# Patient Record
Sex: Female | Born: 1940 | Race: White | Hispanic: No | Smoking: Never smoker
Health system: Southern US, Community
[De-identification: ages and names within clinical notes are randomized; demographics above are authoritative.]

## PROBLEM LIST (undated history)

## (undated) DIAGNOSIS — T8859XA Other complications of anesthesia, initial encounter: Secondary | ICD-10-CM

## (undated) DIAGNOSIS — R32 Unspecified urinary incontinence: Secondary | ICD-10-CM

## (undated) DIAGNOSIS — M1990 Unspecified osteoarthritis, unspecified site: Secondary | ICD-10-CM

## (undated) DIAGNOSIS — H3530 Unspecified macular degeneration: Secondary | ICD-10-CM

## (undated) DIAGNOSIS — J189 Pneumonia, unspecified organism: Secondary | ICD-10-CM

## (undated) DIAGNOSIS — F419 Anxiety disorder, unspecified: Secondary | ICD-10-CM

## (undated) DIAGNOSIS — F32A Depression, unspecified: Secondary | ICD-10-CM

## (undated) DIAGNOSIS — G4762 Sleep related leg cramps: Secondary | ICD-10-CM

## (undated) DIAGNOSIS — Z87440 Personal history of urinary (tract) infections: Secondary | ICD-10-CM

## (undated) DIAGNOSIS — K219 Gastro-esophageal reflux disease without esophagitis: Secondary | ICD-10-CM

## (undated) DIAGNOSIS — F329 Major depressive disorder, single episode, unspecified: Secondary | ICD-10-CM

## (undated) DIAGNOSIS — C801 Malignant (primary) neoplasm, unspecified: Secondary | ICD-10-CM

## (undated) DIAGNOSIS — H442B3 Degenerative myopia with macular hole, bilateral eye: Secondary | ICD-10-CM

## (undated) DIAGNOSIS — T4145XA Adverse effect of unspecified anesthetic, initial encounter: Secondary | ICD-10-CM

## (undated) HISTORY — PX: OTHER SURGICAL HISTORY: SHX169

## (undated) HISTORY — PX: APPENDECTOMY: SHX54

## (undated) HISTORY — PX: EYE SURGERY: SHX253

## (undated) HISTORY — PX: TONSILLECTOMY: SUR1361

## (undated) HISTORY — PX: BLADDER AUGMENTATION: SHX1233

## (undated) HISTORY — PX: ABDOMINAL HYSTERECTOMY: SHX81

## (undated) HISTORY — PX: KNEE ARTHROSCOPY: SUR90

## (undated) HISTORY — PX: TUBAL LIGATION: SHX77

## (undated) HISTORY — PX: BICEPS TENDON REPAIR: SHX566

---

## 1997-07-01 ENCOUNTER — Ambulatory Visit (HOSPITAL_BASED_OUTPATIENT_CLINIC_OR_DEPARTMENT_OTHER): Admission: RE | Admit: 1997-07-01 | Discharge: 1997-07-01 | Payer: Self-pay | Admitting: Orthopedic Surgery

## 1998-11-23 ENCOUNTER — Encounter: Payer: Self-pay | Admitting: Orthopedic Surgery

## 1998-11-29 ENCOUNTER — Inpatient Hospital Stay (HOSPITAL_COMMUNITY): Admission: RE | Admit: 1998-11-29 | Discharge: 1998-12-04 | Payer: Self-pay | Admitting: Orthopedic Surgery

## 1999-12-28 ENCOUNTER — Ambulatory Visit (HOSPITAL_BASED_OUTPATIENT_CLINIC_OR_DEPARTMENT_OTHER): Admission: RE | Admit: 1999-12-28 | Discharge: 1999-12-29 | Payer: Self-pay | Admitting: Orthopedic Surgery

## 2000-09-17 ENCOUNTER — Ambulatory Visit (HOSPITAL_BASED_OUTPATIENT_CLINIC_OR_DEPARTMENT_OTHER): Admission: RE | Admit: 2000-09-17 | Discharge: 2000-09-17 | Payer: Self-pay | Admitting: Orthopedic Surgery

## 2001-11-13 ENCOUNTER — Ambulatory Visit (HOSPITAL_BASED_OUTPATIENT_CLINIC_OR_DEPARTMENT_OTHER): Admission: RE | Admit: 2001-11-13 | Discharge: 2001-11-14 | Payer: Self-pay | Admitting: Orthopedic Surgery

## 2006-03-26 ENCOUNTER — Encounter: Admission: RE | Admit: 2006-03-26 | Discharge: 2006-03-26 | Payer: Self-pay | Admitting: Orthopedic Surgery

## 2009-01-04 ENCOUNTER — Ambulatory Visit (HOSPITAL_BASED_OUTPATIENT_CLINIC_OR_DEPARTMENT_OTHER): Admission: RE | Admit: 2009-01-04 | Discharge: 2009-01-04 | Payer: Self-pay | Admitting: Orthopedic Surgery

## 2009-03-10 ENCOUNTER — Encounter: Admission: RE | Admit: 2009-03-10 | Discharge: 2009-03-10 | Payer: Self-pay | Admitting: Orthopedic Surgery

## 2009-03-15 ENCOUNTER — Ambulatory Visit (HOSPITAL_BASED_OUTPATIENT_CLINIC_OR_DEPARTMENT_OTHER): Admission: RE | Admit: 2009-03-15 | Discharge: 2009-03-15 | Payer: Self-pay | Admitting: Orthopedic Surgery

## 2009-07-31 ENCOUNTER — Encounter: Admission: RE | Admit: 2009-07-31 | Discharge: 2009-07-31 | Payer: Self-pay | Admitting: Orthopedic Surgery

## 2010-03-19 LAB — POCT HEMOGLOBIN-HEMACUE: Hemoglobin: 14.2 g/dL (ref 12.0–15.0)

## 2010-03-25 LAB — POCT HEMOGLOBIN-HEMACUE: Hemoglobin: 12 g/dL (ref 12.0–15.0)

## 2010-05-20 NOTE — Op Note (Signed)
[place]. [facility]  Patient:    Candice Nolan, Candice Nolan Visit Number: [id number] MRN: [id number]          Service Type: DSU Location: [facility] Attending Physician:  Alinda Deem Dictated by:   Alinda Deem, M.D. Proc. Date: 09/17/00 Admit Date:  09/17/2000                             Operative Report  PREOPERATIVE DIAGNOSIS:  Left elbow osteochondral loose body.  POSTOPERATIVE DIAGNOSES:  Left elbow chondral flap tears of the radial head as well as the tip of the olecranon, status post elbow dislocation five or six months ago.  PROCEDURE:  Left elbow arthroscopic removal of chondromalacia with flap tears from the radial head and the tip of the olecranon.  SURGEON:  Feliberto Gottron. Turner Daniels, M.D.  FIRST ASSISTANT:  Dorthula Matas, P.A.-C.  ANESTHETIC:  General endotracheal.  ESTIMATED BLOOD LOSS:  Minimal.  FLUID REPLACEMENT:  700 cc of crystalloid.  DRAINS PLACED:  None.  TOURNIQUET TIME:  None.  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  She has had some persistent pain, catching, and feelings of subluxation or something like it since coming out of her range of motion brace about a month ago.  MRI scan shows some possible chondromalacia of the radial head with loose bodies, and she is taken for arthroscopic evaluation of her left elbow as well as evaluation under anesthesia for ligamentous instability.  She did regain a full range of motion after her injury and has been able to function as a rehab nurse over the last six weeks without too much difficulty including some travel.  DESCRIPTION OF PROCEDURE:  The patient identified by arm band, taken to the operating room at [facility].  Appropriate anesthetic monitors were attached, and general endotracheal anesthesia  induced with the patient in the supine position.  She was then rolled into the right lateral decubitus position and fixed there with a bean bag, and then the left upper extremity prepped and draped over the arthroscopic elbow holding device with a tourniquet applied high to the left arm but never used.  Using the anterolateral portal just coming over the radial head, an 18-gauge needle was inserted into the joint, and the elbow joint filled up with 0.5% Marcaine and epinephrine solution.  Using a #11 blade to half the depth, we then made an anterolateral portal and introduced the 2.9-mm arthroscope from [employer]. Under arthroscopic guidance, an anteromedial portal was then made allowing [place] to remove some inflamed synovium to the anterior compartment and to better visualize the radial head which had a fairly impressive cartilage flap tear with a loose body which was debrided with a 3.5-mm Gator sucker shaver from [employer].  There was also some chondromalacia off the tip of the coronoid process which was intact, and this was debrided as well.  As far as ligamentous instability goes, the elbow did not open up unusually to the valgus stress test or the varus stress test, although I would grade the laxity at 1+ since the scope could be moved into the gap between the radial head and the capitellum without too much difficulty.  Satisfied with the anterior debridement and removal of the flap tears, photographic documentation was then made.  A mid-posterior portal was then made  allowing introduction of the arthroscope into the posterior compartment which was also evaluated, and there was a slight amount of chondromalacia off the tip of the olecranon, probably from either the reduction or dislocation maneuver.  An accessory anterolateral portal was then made in the groove between the midolecranon and the lateral side of the humeral condyle allowing visualization of the bare spot in the olecranon and  visualization of the radial head from posterior.  No further tears were noted.  At this point, the elbow was washed out with normal saline solution.  The arthroscopic instruments were removed, dressed in Xeroform, 4 x 4 dressing, sponges, Webril, and Ace wrap, and a sling applied.  The patient was awakened and taken to the recovery room without difficulty.  The mid-posterior portal was closed with a single Steri-Strip prior to the application of the dressing.  At this point, the patient was awakened and taken to the recovery room without difficulty. Dictated by:   Alinda Deem, M.D. Attending Physician:  Alinda Deem DD:  09/17/00 TD:  09/17/00 Job: [id number]

## 2010-05-20 NOTE — Discharge Summary (Signed)
[place]. [facility]  Patient:    Candice Nolan, Candice Nolan                       MRN: [id number] Adm. Date:  11/29/98 Disc. Date: 12/04/98 Attending:  Alinda Deem, M.D. Dictator:   Dorthula Matas, P.A.-C.                           Discharge Summary  PRIMARY DIAGNOSIS:  End-stage degenerative joint disease of the right knee.  PROCEDURE WHILE IN HOSPITAL:  Right total knee arthroplasty and insertion of epidural catheter for postoperative pain management.  INTRODUCTION:  The patient is a 70 year old female admitted for treatment of DJD of the right knee, end stage.  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 Turner Daniels after failing conservative care after an injury.  She also underwent arthroscopy in 1998 with partial arthroscopic lateral medial meniscectomies and chondromalacia. She has continued with postoperative pain and swelling, failed nonsteroidal anti-inflammatories, rest, and physical therapy.  No postoperative complications. The patients pain awakens her at night, prevents ADLs.  The patient cannot walk  distances secondary to pain.  The patient wishes elective total knee arthroplasty. X-rays show arthritic narrowing with sclerotic changes.  SOCIAL HISTORY:  No tobacco, no ethanol, no IV drug abuse.  She is a case Production designer, theatre/television/film with [employer].  She lives with her husband.  PAST MEDICAL HISTORY:  Usual childhood diseases.  FAMILY HISTORY:  Includes cancer, CAD.  Father died with an MI, mother with cancer. Adult diseases DJD.  PAST SURGICAL HISTORY:  Knee arthroscopy in 1998-1999.  Hysterectomy.  Bladder surgery in 1991.  Carpal tunnel in 1998.  Appendectomy.  No difficulties with ______ .  ALLERGIES:  IODINE causes respiratory distress.  TALWIN causes breathing difficulties.  VITAMIN C causes swelling.  MEDICATIONS ON  ADMISSION: 1. Imodium AD 2 to 4 p.o. q.d. 2. Nitrofurantoin 100 mg 1 p.o. q.d. 3. Estratest 1 p.o. q.d. 4. Ritalin 20 mg 1 p.o. q.h.s. 5. Zoloft 100 mg 1/2 b.i.d. 5. Ritalin SR 20 mg 1 p.o. q.a.m. 6. Darvocet 100 mg 1 p.o. q.6h. 7. Aflexa 1 p.o. b.i.d.  REVIEW OF SYSTEMS:  Positive for decreased hearing and wearing of glasses. Positive for irritable bowel syndrome and urinary retention.  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  PHYSICAL EXAMINATION:  VITAL SIGNS:  The patient is 61 inches tall, weighs 162 pound.  Blood pressure 130/76.  HEENT:  Head is normocephalic, atraumatic.  Eyes PERRLA, EOM.  Ears: TMs clear.  Nose: Clear, no polyps.  Throat clear.  NECK:  Supple with full range of motion.  LUNGS:  Clear to auscultation and percussion.  CARDIOVASCULAR:  Regular rate and rhythm with no murmurs, rubs, or gallops.  ABDOMEN:   Soft, nontender, no masses.  Bowel sounds 2+.  GU/RECTAL:  Deferred.  NEUROLOGIC:  Alert and oriented x 3.  Normal speech pattern.  Cranial nerves II-XII grossly intact.  ORTHOPEDIC:  Right knee has no effusion.  Tender over medial greater than lateral joint line.  Moderate pain and joint tenderness.  Forward flexion 120, forward flexion retraction 0.  Pain to all ranges of motion.  Neurovascularly intact to  light touch motor and DTRs with 2+ PT and DP pulses.  LABORATORY DATA:  Preoperative labs including CBC, CMET, chest x-ray, EKG, PT, nd  PTT were all within normal limits with the exception of an EKG which showed nonspecific T wave abnormality which had increased since 1998 tracing.  HOSPITAL COURSE:  On the day of admission, the patient was taken to the operating room of [facility] where she underwent a right total knee arthroplasty using [employer] Scorpio components, #7 femur, #5 tibia, 10 mm PS spacer, #5 medial patella button.  All components were cemented.  Foley catheter and medium Hemovac  were placed.  She was placed on perioperative antibiotics for the first 48 hours.  She was placed on postoperative epidural catheter for pain  relief.  She began physical therapy the first postoperative day with CPM placed  postoperatively.  On postoperative day #1, the patient was awake and alert.  She had some mild nausea.  Vital signs were stable with T-max 102.0 and ranged 100.2.  PS stockings in place.  Hemoglobin 10.7.  Urine output 700 cc each shift.  PT 15.5.  She was  neurovascularly intact and otherwise orthopedically stable.  Physical therapy was begun, and a rehabilitation consult was obtained.  It was felt that the patient  would probably progress well and not require further inpatient rehabilitation, ut they would follow.  She remained on the postoperative epidural.  She was also placed on postoperative Coumadin per pharmacy protocol and remained on that.  On the second postoperative day, the patient was awake and alert.  She was taking P.o.s well.  T-max 102 ranging to 99.  Wound was clean and dry, no pus, no drainage.  The drain had 40 cc of output and was discontinued.  She was neurovascularly intact.  Hemoglobin 9.4, WBC 9.8.  Foley was working well.  She was otherwise stable and continued with Coumadin and physical therapy.  Her epidural was discontinued on the second postoperative day, and she was switched to p.o. ain medications.  On the third postoperative day, the patient reported steady improvement.  T-max  100.8, ranging 97.3.  Vital signs were stable.  Hemoglobin 8.8, WBC 9.6.  INR 1.4. Dressing was dry.  Wound was benign.  She continued to make slow but steady progress in therapy.  On postoperative day #4, the patient was complaining of some increased pain and  swelling in her right calf.  T-max 100.5, ranging 98.4.  Vital signs were stable with some increased pulse.  INR 1.6.  Dressing was dry.  Wound was benign.  She had a mild effusion with  positive increased calf edema as well as ecchymosis and increased tenderness which also was increased by any type of movement for flexion.  She remained neurovascularly intact.  She underwent a Doppler study which ruled out any type of DVT, and she continued after that with physical therapy.  On postoperative day #5, the patient was without complaint.  She was afebrile.  Vital signs were stable.  Urine output remained steady.  Foley had been discontinued the day before.  The patient does do in and out catheterization herself and was having no difficulty with this.  INR 1.7.  Range of motion of the knee 0 to 70 degrees on CPM.  Wound was benign.  Pulses were intact.  The calf as decreased in tenderness.  She was otherwise medically stable and orthopedically  improved and was discharged home today in the care of her family.  DIET:  Regular.  DISCHARGE MEDICATIONS:  Coumadin per pharmacy protocol, Tylox for pain.  Resume  home medications.  ACTIVITY:  Weightbearing as tolerated with  walker or crutch.  WOUND CARE:  Dressing change q.d. or as needed.  FOLLOWUP:  Return to clinic in one week time with Dr. Turner Daniels, sooner if she should have increase in temperature greater than 101 and increased drainage, or pain not well controlled by oral pain medications.  CONDITION ON DISCHARGE:  At the time of her discharge, she was transferring independently and had passed her physical therapy goals. DD:  01/12/99 TD:  01/12/99 Job: [id number]

## 2010-05-20 NOTE — Procedures (Signed)
[place]. [facility]  Patient:    MAYUMI SUMMERSON                    MRN: [id number] Proc. Date: 11/29/98 Adm. Date:  [id number] Attending:  Alinda Deem CC:         The Anesthesia Department                           Procedure Report  PROCEDURE:  Epidural catheter placement for postoperative pain relief.  BODY OF REPORT:  I was consulted by Dr. ______  to placement of epidural catheter into Ms. Stys for postoperative pain relief.  I had the opportunity to discuss the procedure with the patient preoperatively, explain the risks and benefits, nd she agreed to proceed.  At the end of the procedure, prior to emergence from anesthesia, the patient was turned into the right lateral decubitus position.  Her back was prepped with Betadine.  Using a #17 Tuohy needle, the epidural space was easily cannulated in the L3-4 interspace in the midline using loss-of-resistance technique.  I threaded a catheter 5 cm into the epidural space, and had frank blood come back through he catheter on aspiration.  I was unable to get that to clear, and withdrew the needle and the catheter.  I then moved one space higher to the L2-3 interspace, and using a #17 Tuohy needle once again, easily cannulated the epidural space.  I threaded a catheter 5 cm into the epidural space and removed the needle.  The catheter was  firmly fixed to the patients back.  After negative aspiration, I injected 10 cc of 0.5% Xylocaine without any problem.  The patient was then turned supine, extubated, and taken to the recovery room in stable condition.  A fentanyl/Marcaine infusion will be done in the PACU, and she will be followed on the floor by the anesthesiology service. DD:  11/29/98 TD:  11/29/98 Job: [id number]

## 2011-12-18 ENCOUNTER — Other Ambulatory Visit: Payer: Self-pay | Admitting: Orthopedic Surgery

## 2011-12-18 DIAGNOSIS — M25512 Pain in left shoulder: Secondary | ICD-10-CM

## 2011-12-23 ENCOUNTER — Ambulatory Visit
Admission: RE | Admit: 2011-12-23 | Discharge: 2011-12-23 | Disposition: A | Discharge status: Home / Self Care | Payer: Medicare Other | Source: Ambulatory Visit | Attending: Orthopedic Surgery | Admitting: Orthopedic Surgery

## 2011-12-23 DIAGNOSIS — M25512 Pain in left shoulder: Secondary | ICD-10-CM

## 2016-12-05 ENCOUNTER — Other Ambulatory Visit: Payer: Self-pay | Admitting: Orthopedic Surgery

## 2017-01-11 ENCOUNTER — Other Ambulatory Visit: Payer: Self-pay

## 2017-01-11 ENCOUNTER — Ambulatory Visit (HOSPITAL_COMMUNITY)
Admission: RE | Admit: 2017-01-11 | Discharge: 2017-01-11 | Disposition: A | Discharge status: Home / Self Care | Payer: Medicare Other | Source: Ambulatory Visit | Attending: Orthopedic Surgery | Admitting: Orthopedic Surgery

## 2017-01-11 ENCOUNTER — Encounter (HOSPITAL_COMMUNITY)
Admission: RE | Admit: 2017-01-11 | Discharge: 2017-01-11 | Disposition: A | Discharge status: Home / Self Care | Payer: Medicare Other | Source: Ambulatory Visit | Attending: Orthopedic Surgery | Admitting: Orthopedic Surgery

## 2017-01-11 ENCOUNTER — Encounter (HOSPITAL_COMMUNITY): Payer: Self-pay

## 2017-01-11 DIAGNOSIS — Z79899 Other long term (current) drug therapy: Secondary | ICD-10-CM | POA: Insufficient documentation

## 2017-01-11 DIAGNOSIS — Z01818 Encounter for other preprocedural examination: Secondary | ICD-10-CM | POA: Diagnosis not present

## 2017-01-11 DIAGNOSIS — Z01810 Encounter for preprocedural cardiovascular examination: Secondary | ICD-10-CM | POA: Insufficient documentation

## 2017-01-11 DIAGNOSIS — R9431 Abnormal electrocardiogram [ECG] [EKG]: Secondary | ICD-10-CM | POA: Diagnosis not present

## 2017-01-11 HISTORY — DX: Major depressive disorder, single episode, unspecified: F32.9

## 2017-01-11 HISTORY — DX: Depression, unspecified: F32.A

## 2017-01-11 HISTORY — DX: Degenerative myopia with macular hole, bilateral: H44.2B3

## 2017-01-11 HISTORY — DX: Malignant (primary) neoplasm, unspecified: C80.1

## 2017-01-11 HISTORY — DX: Unspecified osteoarthritis, unspecified site: M19.90

## 2017-01-11 HISTORY — DX: Personal history of urinary (tract) infections: Z87.440

## 2017-01-11 HISTORY — DX: Gastro-esophageal reflux disease without esophagitis: K21.9

## 2017-01-11 HISTORY — DX: Anxiety disorder, unspecified: F41.9

## 2017-01-11 HISTORY — DX: Adverse effect of unspecified anesthetic, initial encounter: T41.45XA

## 2017-01-11 HISTORY — DX: Other complications of anesthesia, initial encounter: T88.59XA

## 2017-01-11 HISTORY — DX: Pneumonia, unspecified organism: J18.9

## 2017-01-11 LAB — COMPREHENSIVE METABOLIC PANEL
ALK PHOS: 68 U/L (ref 38–126)
ALT: 15 U/L (ref 14–54)
ANION GAP: 10 (ref 5–15)
AST: 20 U/L (ref 15–41)
Albumin: 4.5 g/dL (ref 3.5–5.0)
BILIRUBIN TOTAL: 0.5 mg/dL (ref 0.3–1.2)
BUN: 27 mg/dL — ABNORMAL HIGH (ref 6–20)
CALCIUM: 9.6 mg/dL (ref 8.9–10.3)
CO2: 25 mmol/L (ref 22–32)
CREATININE: 0.66 mg/dL (ref 0.44–1.00)
Chloride: 103 mmol/L (ref 101–111)
Glucose, Bld: 94 mg/dL (ref 65–99)
Potassium: 3.8 mmol/L (ref 3.5–5.1)
Sodium: 138 mmol/L (ref 135–145)
TOTAL PROTEIN: 7.3 g/dL (ref 6.5–8.1)

## 2017-01-11 LAB — CBC WITH DIFFERENTIAL/PLATELET
Basophils Absolute: 0 10*3/uL (ref 0.0–0.1)
Basophils Relative: 1 %
Eosinophils Absolute: 0.1 10*3/uL (ref 0.0–0.7)
Eosinophils Relative: 1 %
HEMATOCRIT: 39 % (ref 36.0–46.0)
HEMOGLOBIN: 12.4 g/dL (ref 12.0–15.0)
LYMPHS ABS: 1.2 10*3/uL (ref 0.7–4.0)
LYMPHS PCT: 18 %
MCH: 29 pg (ref 26.0–34.0)
MCHC: 31.8 g/dL (ref 30.0–36.0)
MCV: 91.1 fL (ref 78.0–100.0)
Monocytes Absolute: 0.3 10*3/uL (ref 0.1–1.0)
Monocytes Relative: 5 %
NEUTROS PCT: 75 %
Neutro Abs: 5.1 10*3/uL (ref 1.7–7.7)
Platelets: 283 10*3/uL (ref 150–400)
RBC: 4.28 MIL/uL (ref 3.87–5.11)
RDW: 13.5 % (ref 11.5–15.5)
WBC: 6.8 10*3/uL (ref 4.0–10.5)

## 2017-01-11 LAB — SURGICAL PCR SCREEN
MRSA, PCR: NEGATIVE
Staphylococcus aureus: NEGATIVE

## 2017-01-11 LAB — TYPE AND SCREEN
ABO/RH(D): A NEG
Antibody Screen: NEGATIVE

## 2017-01-11 LAB — ABO/RH: ABO/RH(D): A NEG

## 2017-01-11 LAB — APTT: aPTT: 33 seconds (ref 24–36)

## 2017-01-11 LAB — PROTIME-INR
INR: 0.9
PROTHROMBIN TIME: 12.1 s (ref 11.4–15.2)

## 2017-01-11 NOTE — Anesthesia Preprocedure Evaluation (Addendum)
Anesthesia Evaluation  Patient identified by MRN, date of birth, ID band Patient awake    Reviewed: Allergy & Precautions, NPO status   History of Anesthesia Complications (+) history of anesthetic complications  Airway Mallampati: II  TM Distance: >3 FB Neck ROM: Full    Dental no notable dental hx.    Pulmonary neg pulmonary ROS,    breath sounds clear to auscultation       Cardiovascular negative cardio ROS   Rhythm:Regular Rate:Normal     Neuro/Psych negative neurological ROS     GI/Hepatic Neg liver ROS, GERD  ,  Endo/Other    Renal/GU negative Renal ROS     Musculoskeletal  (+) Arthritis ,   Abdominal (+) + obese,   Peds  Hematology   Anesthesia Other Findings   Reproductive/Obstetrics                             Anesthesia Physical Anesthesia Plan  ASA: II  Anesthesia Plan: General   Post-op Pain Management:  Regional for Post-op pain   Induction: Intravenous  PONV Risk Score and Plan: 4 or greater and Treatment may vary due to age or medical condition, Ondansetron and Dexamethasone  Airway Management Planned: Oral ETT  Additional Equipment:   Intra-op Plan:   Post-operative Plan: Extubation in OR  Informed Consent: I have reviewed the patients History and Physical, chart, labs and discussed the procedure including the risks, benefits and alternatives for the proposed anesthesia with the patient or authorized representative who has indicated his/her understanding and acceptance.   Dental advisory given  Plan Discussed with: CRNA  Anesthesia Plan Comments: (See my note, particularly regarding anesthesia concerns/inquiries. Myra Gianotti, PA-C)       Anesthesia Quick Evaluation

## 2017-01-11 NOTE — Pre-Procedure Instructions (Signed)
Candice Nolan  01/11/2017      [facility] [place],  - [address] [place] [address] Phone: [phone] Fax: [phone]    Your procedure is scheduled on January 18, 2017.  Report to [facility] Admitting at 530 AM.  Call this number if you have problems the morning of surgery:  [phone]   Remember:  Do not eat food or drink liquids after midnight.  Take these medicines the morning of surgery with A SIP OF WATER hydrocodone-acetaminophen (norco), omeprazole (prilosec),.  7 days prior to surgery STOP taking any meloxicam (mobic), Aspirin (unless otherwise instructed by your surgeon), Aleve, Naproxen, Ibuprofen, Motrin, Advil, Goody's, BC's, all herbal medications, fish oil, and all vitamins  Continue all other medications as instructed by your physician except follow the above medication instructions before surgery   Do not wear jewelry, make-up or nail polish.  Do not wear lotions, powders, or perfumes, or deodorant.  Do not shave 48 hours prior to surgery.   Do not bring valuables to the hospital.  [facility] is not responsible for any belongings or valuables.  Contacts, dentures or bridgework may not be worn into surgery.  Leave your suitcase in the car.  After surgery it may be brought to your room.  For patients admitted to the hospital, discharge time will be determined by your treatment team.  Patients discharged the day of surgery will not be allowed to drive home.   Special instructions:  [place]- Preparing For Surgery  Before surgery, you can play an important role. Because skin is not sterile, your skin needs to be as free of germs as possible. You can reduce the number of germs on your skin by washing with CHG (chlorahexidine gluconate) Soap before surgery.  CHG is an antiseptic cleaner which kills germs and bonds with the skin to continue killing germs even after washing.  Please do not  use if you have an allergy to CHG or antibacterial soaps. If your skin becomes reddened/irritated stop using the CHG.  Do not shave (including legs and underarms) for at least 48 hours prior to first CHG shower. It is OK to shave your face.  Please follow these instructions carefully.   1. Shower the NIGHT BEFORE SURGERY and the MORNING OF SURGERY with CHG.   2. If you chose to wash your hair, wash your hair first as usual with your normal shampoo.  3. After you shampoo, rinse your hair and body thoroughly to remove the shampoo.  4. Use CHG as you would any other liquid soap. You can apply CHG directly to the skin and wash gently with a scrungie or a clean washcloth.   5. Apply the CHG Soap to your body ONLY FROM THE NECK DOWN.  Do not use on open wounds or open sores. Avoid contact with your eyes, ears, mouth and genitals (private parts). Wash Face and genitals (private parts)  with your normal soap.  6. Wash thoroughly, paying special attention to the area where your surgery will be performed.  7. Thoroughly rinse your body with warm water from the neck down.  8. DO NOT shower/wash with your normal soap after using and rinsing off the CHG Soap.  9. Pat yourself dry with a CLEAN TOWEL.  10. Wear CLEAN PAJAMAS to bed the night before surgery, wear comfortable clothes the morning of surgery  11. Place CLEAN SHEETS on your bed the night of your  first shower and DO NOT SLEEP WITH PETS.  Day of Surgery: Do not apply any deodorants/lotions. Please wear clean clothes to the hospital/surgery center.    Please read over the following fact sheets that you were given. Pain Booklet, Coughing and Deep Breathing, MRSA Information and Surgical Site Infection Prevention

## 2017-01-11 NOTE — Progress Notes (Addendum)
HYI:FOYDXA, Christian Mate, MD  Cardiologist: pt denies  EKG: pt denies past year  Stress test: 5+ years ago  ECHO: pt denies ever  Cardiac Cath: pt denies ever  Chest x-ray: pt denies past year, obtained today  Pt refuses to give urine speciman.  She has had bladder augmentation 25 years ago and self caths.  She said it always comes back with bacteria and she does not need to be on antibiotics

## 2017-01-11 NOTE — Progress Notes (Addendum)
Anesthesia PAT Evaluation: Patient is a 77 year old female scheduled for right reverse shoulder arthroplasty on 01/18/17 by Dr. Tania Ade.   Patient requested anesthesia consult while at PAT. She wanted to discuss type of anesthesia and review a few other concerns.  -  Based on input from anesthesiologist Dr. Tamela Gammon, I discussed that she should anticipate nerve block (right interscalene) and pre-medication with IV Versed and Fentanyl and general anesthesia. She inquired if PCA would be used post-operatively, but I deferred her back to Dr. Tamera Punt.  - She takes alprazolam prior to procedures. If she does not take this, she feels her nerves with lead to her acting harsh. She also had a bad exerperiecne during a colonoscopy when the oral airway was placed before she was sedated. We discussed that she can take alprazolam as prescribed on the morning of surgery. She will plan to take this just before she arrives in hopes she will feel more at ease for her surgery.  - She is concerned about bowel and bladder issues. She is going to watch her diet prior to surgery. She is prone to retaining large volumes or urine, so she reported that Dr. Tamera Punt is planning to have a foley catheter placed.  - We also discussed her prior anesthesia history that included a reported elevated temperature after surgery in 1970 carpal tunnel surgery--was initially unsure if could have been malignant hyperthermia ([place]), but denied getting a letter from anesthesia or having to go to the ICU. She thinks she may have stayed overnight for observation. She has had multiple procedures since without incidence. She had left shoulder arthrosc[facility] at [facility] on 03/15/09 (Dr. Mayer Camel) and received Suprane/Ultane and succinylcholine with reaction.   History includes never smoker, GERD, skin cancer excision (BCC), bladder augmentation (occasional I&O cath), anxiety, depression, arthritis, bilateral degenerative myopia with macular  hold, right TKR, hysterectomy, appendectomy, left shoulder arthroscopy '11, carpal tunnel release '70 [facility] Ash), tonsillectomy. 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  PCP is Dr. Merilynn Finland.  Meds include afibercept (macular degeneration), Xanax, Norco, Ritalin LA, Prilosec calcium with vitamin D, cranberry capsules (for UTI prevention).   BP (!) 159/61   Pulse 84   Temp 36.5 C   Resp 18   Ht 4\' 11"  (1.499 m)   Wt 131 lb 6.4 oz (59.6 kg)   SpO2 98%   BMI 26.54 kg/m  Exam shows a pleasant Caucasian female in NAD. She is wearing glasses. She can ambulate independently. Heart RRR, no murmur noted. Lungs clear.  EKG 01/11/17: NSR, non-specific ST abnormality. No significant change since last tracing 03/10/09.  CXR 01/11/17: IMPRESSION: No evidence for acute cardiopulmonary abnormality.  Preoperative labs noted. She was unable to provide a UA at PAT, because she had done an I&O cath prior to her appointment. I left a voice message with Myriam Jacobson at Dr. Bettina Gavia office regarding this. Can be done on the day of surgery unless Dr. Tamera Punt wants patient to have beforehand.  If no acute changes then I anticipate that she can proceed as planned. Prior anesthesia history discussed with Dr. Tobias Alexander and he reviewed 03/15/09 anesthesia records. Her description of events in 1970 do not sound consistent with MH. She did not think she had ever been told she may have had MH reaction. Furthermore, she received triggering agents on 03/15/09 without incident, so no planned anesthesia restrictions at this point. Her assigned anesthesiologist will evaluate on the day of surgery.  Myra Gianotti, PA-C [facility] Short Stay Center/Anesthesiology  Phone [phone] 01/11/2017 6:04 PM

## 2017-01-18 ENCOUNTER — Other Ambulatory Visit: Payer: Self-pay

## 2017-01-18 ENCOUNTER — Inpatient Hospital Stay (HOSPITAL_COMMUNITY): Payer: Medicare Other | Admitting: Anesthesiology

## 2017-01-18 ENCOUNTER — Encounter (HOSPITAL_COMMUNITY): Payer: Self-pay

## 2017-01-18 ENCOUNTER — Inpatient Hospital Stay (HOSPITAL_COMMUNITY): Payer: Medicare Other | Admitting: Vascular Surgery

## 2017-01-18 ENCOUNTER — Encounter (HOSPITAL_COMMUNITY)
Admission: RE | Disposition: A | Discharge status: Home / Self Care | Payer: Self-pay | Source: Ambulatory Visit | Attending: Orthopedic Surgery

## 2017-01-18 ENCOUNTER — Inpatient Hospital Stay (HOSPITAL_COMMUNITY)
Admission: RE | Admit: 2017-01-18 | Discharge: 2017-01-19 | DRG: 483 | Disposition: A | Discharge status: Home / Self Care | Payer: Medicare Other | Source: Ambulatory Visit | Attending: Orthopedic Surgery | Admitting: Orthopedic Surgery

## 2017-01-18 ENCOUNTER — Inpatient Hospital Stay (HOSPITAL_COMMUNITY): Payer: Medicare Other

## 2017-01-18 DIAGNOSIS — M75101 Unspecified rotator cuff tear or rupture of right shoulder, not specified as traumatic: Secondary | ICD-10-CM | POA: Diagnosis present

## 2017-01-18 DIAGNOSIS — F329 Major depressive disorder, single episode, unspecified: Secondary | ICD-10-CM | POA: Diagnosis present

## 2017-01-18 DIAGNOSIS — Z6826 Body mass index (BMI) 26.0-26.9, adult: Secondary | ICD-10-CM

## 2017-01-18 DIAGNOSIS — H442B3 Degenerative myopia with macular hole, bilateral eye: Secondary | ICD-10-CM | POA: Diagnosis present

## 2017-01-18 DIAGNOSIS — M19011 Primary osteoarthritis, right shoulder: Secondary | ICD-10-CM | POA: Diagnosis present

## 2017-01-18 DIAGNOSIS — Z888 Allergy status to other drugs, medicaments and biological substances status: Secondary | ICD-10-CM | POA: Diagnosis not present

## 2017-01-18 DIAGNOSIS — Z91041 Radiographic dye allergy status: Secondary | ICD-10-CM | POA: Diagnosis not present

## 2017-01-18 DIAGNOSIS — Z85828 Personal history of other malignant neoplasm of skin: Secondary | ICD-10-CM | POA: Diagnosis not present

## 2017-01-18 DIAGNOSIS — Z96611 Presence of right artificial shoulder joint: Secondary | ICD-10-CM

## 2017-01-18 DIAGNOSIS — Z79899 Other long term (current) drug therapy: Secondary | ICD-10-CM

## 2017-01-18 DIAGNOSIS — Z90710 Acquired absence of both cervix and uterus: Secondary | ICD-10-CM | POA: Diagnosis not present

## 2017-01-18 DIAGNOSIS — F419 Anxiety disorder, unspecified: Secondary | ICD-10-CM | POA: Diagnosis present

## 2017-01-18 DIAGNOSIS — K219 Gastro-esophageal reflux disease without esophagitis: Secondary | ICD-10-CM | POA: Diagnosis present

## 2017-01-18 DIAGNOSIS — M25711 Osteophyte, right shoulder: Secondary | ICD-10-CM | POA: Diagnosis present

## 2017-01-18 DIAGNOSIS — E669 Obesity, unspecified: Secondary | ICD-10-CM | POA: Diagnosis present

## 2017-01-18 HISTORY — PX: REVERSE SHOULDER ARTHROPLASTY: SHX5054

## 2017-01-18 SURGERY — ARTHROPLASTY, SHOULDER, TOTAL, REVERSE
Anesthesia: Choice | Site: Shoulder | Laterality: Right

## 2017-01-18 MED ORDER — CEFAZOLIN SODIUM-DEXTROSE 1-4 GM/50ML-% IV SOLN
1.0000 g | Freq: Four times a day (QID) | INTRAVENOUS | Status: AC
  Administered 2017-01-18 – 2017-01-19 (×3): 1 g via INTRAVENOUS
  Filled 2017-01-18 (×3): qty 50

## 2017-01-18 MED ORDER — DEXTROSE 5 % IV SOLN
INTRAVENOUS | Status: DC | PRN
  Administered 2017-01-18: 20 ug/min via INTRAVENOUS

## 2017-01-18 MED ORDER — MIDAZOLAM HCL 5 MG/5ML IJ SOLN
INTRAMUSCULAR | Status: DC | PRN
  Administered 2017-01-18: 1 mg via INTRAVENOUS

## 2017-01-18 MED ORDER — METHYLPHENIDATE HCL ER (LA) 10 MG PO CP24
30.0000 mg | ORAL_CAPSULE | Freq: Every day | ORAL | Status: DC
  Administered 2017-01-19: 30 mg via ORAL
  Filled 2017-01-18: qty 3

## 2017-01-18 MED ORDER — MENTHOL 3 MG MT LOZG: 1.0000 | LOZENGE | OROMUCOSAL | Status: DC | PRN

## 2017-01-18 MED ORDER — SODIUM CHLORIDE 0.9 % IR SOLN
Status: DC | PRN
  Administered 2017-01-18: 3000 mL
  Administered 2017-01-18: 1000 mL

## 2017-01-18 MED ORDER — ASPIRIN EC 325 MG PO TBEC
325.0000 mg | DELAYED_RELEASE_TABLET | Freq: Every day | ORAL | Status: DC
  Administered 2017-01-18 – 2017-01-19 (×2): 325 mg via ORAL
  Filled 2017-01-18 (×2): qty 1

## 2017-01-18 MED ORDER — FENTANYL CITRATE (PF) 250 MCG/5ML IJ SOLN
INTRAMUSCULAR | Status: DC | PRN
  Administered 2017-01-18 (×2): 50 ug via INTRAVENOUS

## 2017-01-18 MED ORDER — PHENOL 1.4 % MT LIQD: 1.0000 | OROMUCOSAL | Status: DC | PRN

## 2017-01-18 MED ORDER — DIPHENHYDRAMINE HCL 12.5 MG/5ML PO ELIX
12.5000 mg | ORAL_SOLUTION | ORAL | Status: DC | PRN
  Administered 2017-01-19: 25 mg via ORAL
  Filled 2017-01-18: qty 10

## 2017-01-18 MED ORDER — METOCLOPRAMIDE HCL 5 MG/ML IJ SOLN: 5.0000 mg | Freq: Three times a day (TID) | INTRAMUSCULAR | Status: DC | PRN

## 2017-01-18 MED ORDER — FLEET ENEMA 7-19 GM/118ML RE ENEM
1.0000 | ENEMA | Freq: Once | RECTAL | Status: DC | PRN
Start: 2017-01-18 — End: 2017-01-19

## 2017-01-18 MED ORDER — OXYCODONE HCL 5 MG PO TABS
10.0000 mg | ORAL_TABLET | ORAL | Status: DC | PRN
  Administered 2017-01-18 – 2017-01-19 (×6): 10 mg via ORAL
  Filled 2017-01-18 (×6): qty 2

## 2017-01-18 MED ORDER — PROPOFOL 10 MG/ML IV BOLUS
INTRAVENOUS | Status: DC | PRN
  Administered 2017-01-18: 100 mg via INTRAVENOUS

## 2017-01-18 MED ORDER — SODIUM CHLORIDE 0.9 % IV SOLN
INTRAVENOUS | Status: DC
  Administered 2017-01-18: 14:00:00 via INTRAVENOUS
  Administered 2017-01-19: 100 mL/h via INTRAVENOUS

## 2017-01-18 MED ORDER — PANTOPRAZOLE SODIUM 40 MG PO TBEC
40.0000 mg | DELAYED_RELEASE_TABLET | Freq: Every day | ORAL | Status: DC
  Administered 2017-01-18 – 2017-01-19 (×2): 40 mg via ORAL
  Filled 2017-01-18 (×2): qty 1

## 2017-01-18 MED ORDER — GLYCOPYRROLATE 0.2 MG/ML IJ SOLN
INTRAMUSCULAR | Status: DC | PRN
  Administered 2017-01-18: 0.2 mg via INTRAVENOUS

## 2017-01-18 MED ORDER — SUGAMMADEX SODIUM 200 MG/2ML IV SOLN
INTRAVENOUS | Status: DC | PRN
  Administered 2017-01-18: 200 mg via INTRAVENOUS

## 2017-01-18 MED ORDER — ONDANSETRON HCL 4 MG/2ML IJ SOLN
INTRAMUSCULAR | Status: DC | PRN
  Administered 2017-01-18: 4 mg via INTRAVENOUS

## 2017-01-18 MED ORDER — BISACODYL 5 MG PO TBEC: 5.0000 mg | DELAYED_RELEASE_TABLET | Freq: Every day | ORAL | Status: DC | PRN

## 2017-01-18 MED ORDER — EPHEDRINE SULFATE 50 MG/ML IJ SOLN
INTRAMUSCULAR | Status: DC | PRN
  Administered 2017-01-18 (×2): 5 mg via INTRAVENOUS

## 2017-01-18 MED ORDER — ONDANSETRON HCL 4 MG/2ML IJ SOLN: 4.0000 mg | Freq: Four times a day (QID) | INTRAMUSCULAR | Status: DC | PRN

## 2017-01-18 MED ORDER — TRANEXAMIC ACID 1000 MG/10ML IV SOLN
1000.0000 mg | INTRAVENOUS | Status: AC
  Administered 2017-01-18: 1000 mg via INTRAVENOUS
  Filled 2017-01-18: qty 1100

## 2017-01-18 MED ORDER — BUPIVACAINE HCL (PF) 0.5 % IJ SOLN
INTRAMUSCULAR | Status: DC | PRN
  Administered 2017-01-18: 20 mL via PERINEURAL

## 2017-01-18 MED ORDER — FENTANYL CITRATE (PF) 100 MCG/2ML IJ SOLN: 25.0000 ug | INTRAMUSCULAR | Status: DC | PRN

## 2017-01-18 MED ORDER — PROPOFOL 10 MG/ML IV BOLUS
INTRAVENOUS | Status: AC
  Filled 2017-01-18: qty 20

## 2017-01-18 MED ORDER — OXYCODONE-ACETAMINOPHEN 5-325 MG PO TABS: 1.0000 | ORAL_TABLET | ORAL | 0 refills | Status: DC | PRN

## 2017-01-18 MED ORDER — ACETAMINOPHEN 500 MG PO TABS
1000.0000 mg | ORAL_TABLET | Freq: Four times a day (QID) | ORAL | Status: AC
  Administered 2017-01-18 – 2017-01-19 (×4): 1000 mg via ORAL
  Filled 2017-01-18 (×4): qty 2

## 2017-01-18 MED ORDER — MIDAZOLAM HCL 2 MG/2ML IJ SOLN
INTRAMUSCULAR | Status: AC
  Filled 2017-01-18: qty 2

## 2017-01-18 MED ORDER — POLYETHYLENE GLYCOL 3350 17 G PO PACK: 17.0000 g | PACK | Freq: Every day | ORAL | Status: DC | PRN

## 2017-01-18 MED ORDER — ZOLPIDEM TARTRATE 5 MG PO TABS: 5.0000 mg | ORAL_TABLET | Freq: Every evening | ORAL | Status: DC | PRN

## 2017-01-18 MED ORDER — ACETAMINOPHEN 325 MG PO TABS
650.0000 mg | ORAL_TABLET | ORAL | Status: DC | PRN
  Administered 2017-01-19: 650 mg via ORAL
  Filled 2017-01-18: qty 2

## 2017-01-18 MED ORDER — ACETAMINOPHEN 650 MG RE SUPP: 650.0000 mg | RECTAL | Status: DC | PRN

## 2017-01-18 MED ORDER — MORPHINE SULFATE (PF) 2 MG/ML IV SOLN: 1.0000 mg | INTRAVENOUS | Status: DC | PRN

## 2017-01-18 MED ORDER — LIDOCAINE HCL (CARDIAC) 20 MG/ML IV SOLN
INTRAVENOUS | Status: DC | PRN
  Administered 2017-01-18: 40 mg via INTRATRACHEAL

## 2017-01-18 MED ORDER — ROCURONIUM BROMIDE 100 MG/10ML IV SOLN
INTRAVENOUS | Status: DC | PRN
  Administered 2017-01-18: 40 mg via INTRAVENOUS

## 2017-01-18 MED ORDER — LACTATED RINGERS IV SOLN
INTRAVENOUS | Status: DC | PRN
  Administered 2017-01-18: 07:00:00 via INTRAVENOUS

## 2017-01-18 MED ORDER — CEFAZOLIN SODIUM-DEXTROSE 2-4 GM/100ML-% IV SOLN
2.0000 g | INTRAVENOUS | Status: AC
  Administered 2017-01-18: 2 g via INTRAVENOUS
  Filled 2017-01-18: qty 100

## 2017-01-18 MED ORDER — DOCUSATE SODIUM 100 MG PO CAPS
100.0000 mg | ORAL_CAPSULE | Freq: Two times a day (BID) | ORAL | Status: DC
  Administered 2017-01-18 – 2017-01-19 (×3): 100 mg via ORAL
  Filled 2017-01-18 (×3): qty 1

## 2017-01-18 MED ORDER — OXYCODONE HCL 5 MG PO TABS: 5.0000 mg | ORAL_TABLET | ORAL | Status: DC | PRN

## 2017-01-18 MED ORDER — ONDANSETRON HCL 4 MG PO TABS: 4.0000 mg | ORAL_TABLET | Freq: Four times a day (QID) | ORAL | Status: DC | PRN

## 2017-01-18 MED ORDER — FENTANYL CITRATE (PF) 250 MCG/5ML IJ SOLN
INTRAMUSCULAR | Status: AC
  Filled 2017-01-18: qty 5

## 2017-01-18 MED ORDER — METHYLPHENIDATE HCL ER 10 MG PO TBCR: 30.0000 mg | EXTENDED_RELEASE_TABLET | Freq: Every day | ORAL | Status: DC

## 2017-01-18 MED ORDER — ALUM & MAG HYDROXIDE-SIMETH 200-200-20 MG/5ML PO SUSP: 30.0000 mL | ORAL | Status: DC | PRN

## 2017-01-18 MED ORDER — METOCLOPRAMIDE HCL 5 MG PO TABS: 5.0000 mg | ORAL_TABLET | Freq: Three times a day (TID) | ORAL | Status: DC | PRN

## 2017-01-18 MED ORDER — DEXAMETHASONE SODIUM PHOSPHATE 10 MG/ML IJ SOLN
INTRAMUSCULAR | Status: DC | PRN
  Administered 2017-01-18: 8 mg via INTRAVENOUS

## 2017-01-18 SURGICAL SUPPLY — 84 items
AID PSTN UNV HD RSTRNT DISP (MISCELLANEOUS) ×1
BASEPLATE P2 COATD GLND 6.5X30 (Shoulder) IMPLANT
BIT DRILL 5/64X5 DISP (BIT) ×2 IMPLANT
BLADE SAW SAG 73X25 THK (BLADE) ×2
BLADE SAW SGTL 73X25 THK (BLADE) ×1 IMPLANT
BLADE SURG 15 STRL LF DISP TIS (BLADE) IMPLANT
BLADE SURG 15 STRL SS (BLADE) ×6
BOWL SMART MIX CTS (DISPOSABLE) IMPLANT
BSPLAT GLND 30 STRL LF SHLDR (Shoulder) ×1 IMPLANT
CHLORAPREP W/TINT 26ML (MISCELLANEOUS) ×3 IMPLANT
CLOSURE STERI-STRIP 1/2X4 (GAUZE/BANDAGES/DRESSINGS) ×1
CLOSURE WOUND 1/2 X4 (GAUZE/BANDAGES/DRESSINGS) ×1
CLSR STERI-STRIP ANTIMIC 1/2X4 (GAUZE/BANDAGES/DRESSINGS) ×1 IMPLANT
COVER MAYO STAND STRL (DRAPES) IMPLANT
COVER SURGICAL LIGHT HANDLE (MISCELLANEOUS) ×3 IMPLANT
DRAPE INCISE IOBAN 66X45 STRL (DRAPES) ×1 IMPLANT
DRAPE ORTHO SPLIT 77X108 STRL (DRAPES) ×6
DRAPE SURG ORHT 6 SPLT 77X108 (DRAPES) ×2 IMPLANT
DRESSING AQUACEL AG SP 3.5X6 (GAUZE/BANDAGES/DRESSINGS) IMPLANT
DRSG AQUACEL AG ADV 3.5X10 (GAUZE/BANDAGES/DRESSINGS) ×3 IMPLANT
DRSG AQUACEL AG SP 3.5X6 (GAUZE/BANDAGES/DRESSINGS) ×3
ELECT BLADE 4.0 EZ CLEAN MEGAD (MISCELLANEOUS) ×3
ELECT REM PT RETURN 9FT ADLT (ELECTROSURGICAL) ×3
ELECTRODE BLDE 4.0 EZ CLN MEGD (MISCELLANEOUS) ×1 IMPLANT
ELECTRODE REM PT RTRN 9FT ADLT (ELECTROSURGICAL) ×1 IMPLANT
EVACUATOR 1/8 PVC DRAIN (DRAIN) IMPLANT
GLOVE BIO SURGEON STRL SZ7 (GLOVE) ×3 IMPLANT
GLOVE BIO SURGEON STRL SZ7.5 (GLOVE) ×3 IMPLANT
GLOVE BIOGEL PI IND STRL 6.5 (GLOVE) IMPLANT
GLOVE BIOGEL PI IND STRL 7.0 (GLOVE) ×1 IMPLANT
GLOVE BIOGEL PI IND STRL 8 (GLOVE) ×1 IMPLANT
GLOVE BIOGEL PI INDICATOR 6.5 (GLOVE) ×4
GLOVE BIOGEL PI INDICATOR 7.0 (GLOVE) ×2
GLOVE BIOGEL PI INDICATOR 8 (GLOVE) ×2
GLOVE SURG SS PI 6.5 STRL IVOR (GLOVE) ×6 IMPLANT
GOWN STRL REUS W/ TWL LRG LVL3 (GOWN DISPOSABLE) ×3 IMPLANT
GOWN STRL REUS W/ TWL XL LVL3 (GOWN DISPOSABLE) ×1 IMPLANT
GOWN STRL REUS W/TWL LRG LVL3 (GOWN DISPOSABLE) ×9
GOWN STRL REUS W/TWL XL LVL3 (GOWN DISPOSABLE) ×3
HANDPIECE INTERPULSE COAX TIP (DISPOSABLE) ×3
HOOD PEEL AWAY FLYTE STAYCOOL (MISCELLANEOUS) ×6 IMPLANT
INSERT SMALL SOCKET 32MM NEU (Insert) ×2 IMPLANT
KIT BASIN OR (CUSTOM PROCEDURE TRAY) ×3 IMPLANT
KIT ROOM TURNOVER OR (KITS) ×3 IMPLANT
MANIFOLD NEPTUNE II (INSTRUMENTS) ×3 IMPLANT
NDL MAYO TROCAR (NEEDLE) IMPLANT
NDL SPNL 22GX3.5 QUINCKE BK (NEEDLE) ×2 IMPLANT
NEEDLE MAYO TROCAR (NEEDLE) ×3 IMPLANT
NEEDLE SPNL 22GX3.5 QUINCKE BK (NEEDLE) IMPLANT
NS IRRIG 1000ML POUR BTL (IV SOLUTION) ×3 IMPLANT
P2 COATDE GLNOID BSEPLT 6.5X30 (Shoulder) ×3 IMPLANT
PACK SHOULDER (CUSTOM PROCEDURE TRAY) ×3 IMPLANT
PAD ARMBOARD 7.5X6 YLW CONV (MISCELLANEOUS) ×6 IMPLANT
RESTRAINT HEAD UNIVERSAL NS (MISCELLANEOUS) ×3 IMPLANT
RETRIEVER SUT HEWSON (MISCELLANEOUS) IMPLANT
SCREW BONE LOCKING RSP 5.0X14 (Screw) ×3 IMPLANT
SCREW BONE LOCKING RSP 5.0X30 (Screw) ×6 IMPLANT
SCREW BONE RSP LOCK 5X14 (Screw) IMPLANT
SCREW BONE RSP LOCK 5X30 (Screw) IMPLANT
SCREW RETAIN W/HEAD 4MM OFFSET (Shoulder) ×2 IMPLANT
SET HNDPC FAN SPRY TIP SCT (DISPOSABLE) ×1 IMPLANT
SLING ARM FOAM STRAP LRG (SOFTGOODS) ×1 IMPLANT
SLING ARM FOAM STRAP MED (SOFTGOODS) ×2 IMPLANT
SLING ARM IMMOBILIZER MED (SOFTGOODS) ×2 IMPLANT
SPONGE LAP 18X18 X RAY DECT (DISPOSABLE) ×1 IMPLANT
SPONGE LAP 4X18 X RAY DECT (DISPOSABLE) IMPLANT
STEM HUMERAL 8X108MM SMALL (Stem) ×2 IMPLANT
STRIP CLOSURE SKIN 1/2X4 (GAUZE/BANDAGES/DRESSINGS) ×2 IMPLANT
SUCTION FRAZIER HANDLE 10FR (MISCELLANEOUS) ×2
SUCTION TUBE FRAZIER 10FR DISP (MISCELLANEOUS) ×1 IMPLANT
SUPPORT WRAP ARM LG (MISCELLANEOUS) ×3 IMPLANT
SUT ETHIBOND NAB CT1 #1 30IN (SUTURE) ×3 IMPLANT
SUT FIBERWIRE #2 38 T-5 BLUE (SUTURE)
SUT MNCRL AB 4-0 PS2 18 (SUTURE) ×3 IMPLANT
SUT SILK 2 0 TIES 17X18 (SUTURE)
SUT SILK 2-0 18XBRD TIE BLK (SUTURE) IMPLANT
SUT VIC AB 2-0 CT1 27 (SUTURE) ×3
SUT VIC AB 2-0 CT1 TAPERPNT 27 (SUTURE) ×1 IMPLANT
SUTURE FIBERWR #2 38 T-5 BLUE (SUTURE) IMPLANT
SYR 30ML LL (SYRINGE) IMPLANT
TOWEL OR 17X26 10 PK STRL BLUE (TOWEL DISPOSABLE) ×3 IMPLANT
TRAY FOLEY CATH SILVER 14FR (SET/KITS/TRAYS/PACK) ×2 IMPLANT
WATER STERILE IRR 1000ML POUR (IV SOLUTION) ×3 IMPLANT
YANKAUER SUCT BULB TIP NO VENT (SUCTIONS) IMPLANT

## 2017-01-18 NOTE — Progress Notes (Signed)
Pt self cathed this morning and is unable to provide a urine sample.  Urine Specimen cup sent to OR with patient

## 2017-01-18 NOTE — Anesthesia Procedure Notes (Signed)
Procedure Name: Intubation Date/Time: 01/18/2017 7:40 AM Performed by: Mariea Clonts, CRNA Pre-anesthesia Checklist: Patient identified, Emergency Drugs available, Suction available and Patient being monitored Patient Re-evaluated:Patient Re-evaluated prior to induction Oxygen Delivery Method: Circle System Utilized Preoxygenation: Pre-oxygenation with 100% oxygen Induction Type: IV induction Ventilation: Mask ventilation without difficulty Laryngoscope Size: Miller and 2 Grade View: Grade I Tube type: Oral Tube size: 6.5 mm Number of attempts: 1 Airway Equipment and Method: Stylet and Oral airway Placement Confirmation: ETT inserted through vocal cords under direct vision,  positive ETCO2 and breath sounds checked- equal and bilateral Tube secured with: Tape Dental Injury: Teeth and Oropharynx as per pre-operative assessment

## 2017-01-18 NOTE — H&P (Signed)
Candice Nolan is an 77 y.o. female.   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  Pain interferes with sleep and quality of life.  Past Medical History:  Diagnosis Date  . Anxiety   . Arthritis   . 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 Cancer ([place])    basal cell skin cancer removed  . Complication of anesthesia    elevated temp post-op '70, no problems since. Received Suprane and sccinylcholine 03/15/09 w/o issue.   . Depression   . GERD (gastroesophageal reflux disease)   . H/O bladder infections   . Pneumonia    as a child    Past Surgical History:  Procedure Laterality Date  . ABDOMINAL HYSTERECTOMY    . APPENDECTOMY    . BLADDER AUGMENTATION    . carpel tunnel Bilateral    x2  . KNEE ARTHROSCOPY Bilateral    numerous times  . TONSILLECTOMY    . TUBAL LIGATION      History reviewed. No pertinent family history. Social History:  reports that  has never smoked. she has never used smokeless tobacco. She reports that she drinks alcohol. She reports that she does not use drugs.  Allergies:  Allergies  Allergen Reactions  . Ascorbate Swelling    facial  . Contrast Media [Iodinated Diagnostic Agents] Shortness Of Breath  . Iodine Shortness Of Breath  . Chloramphenicol     UNSPECIFIED REACTION    . Pentazocine Anxiety  . Tyloxapol Anxiety    Medications Prior to Admission  Medication Sig Dispense Refill  . Aflibercept (EYLEA IO) Inject into the eye See admin instructions. Every 8-10 weeks    . ALPRAZolam (XANAX) 0.25 MG tablet Take 0.25 mg by mouth as needed for anxiety.    Marland Kitchen CALCIUM-VITAMIN D PO Take 1 tablet by mouth daily.    . Cranberry 500 MG CAPS Take 500 mg by mouth daily.    Marland Kitchen HYDROcodone-acetaminophen (NORCO/VICODIN) 5-325 MG tablet Take 0.5-1 tablets by mouth 2 (two) times daily as needed for moderate  pain (DEPENDS ON PAIN IF TAKES 0.5-1 TABLET).    Marland Kitchen loperamide (IMODIUM) 2 MG capsule Take 2 mg by mouth as needed for diarrhea or loose stools.    . meloxicam (MOBIC) 15 MG tablet Take 15 mg by mouth daily.    . methylphenidate (RITALIN LA) 30 MG 24 hr capsule Take 30 mg by mouth every morning.    . Multiple Vitamin (MULTIVITAMIN WITH MINERALS) TABS tablet Take 1 tablet by mouth daily.    . Multiple Vitamins-Minerals (PRESERVISION AREDS 2+MULTI VIT) CAPS Take 1 tablet by mouth 2 (two) times daily.    . naproxen sodium (ALEVE) 220 MG tablet Take 220-440 mg by mouth 2 (two) times daily as needed (PAIN). DEPENDS ON PAIN IF TAKES 1-2 TABLETS    . omeprazole (PRILOSEC) 20 MG capsule Take 20 mg by mouth daily.    . Probiotic Product (PROBIOTIC PO) Take 1 tablet by mouth every evening.      No results found for this or any previous visit (from the past 48 hour(s)). No results found.  Review of Systems  All other systems reviewed and are negative.   Blood pressure (!) 139/54, pulse 66, temperature 97.7 F (36.5 C), temperature source Oral, resp. rate 18, height 4\' 11"  (1.499 m), weight 59.6 kg (131 lb 6.4 oz), SpO2 100 %. Physical Exam  Constitutional:  She is oriented to person, place, and time. She appears well-developed and well-nourished.  HENT:  Head: Atraumatic.  Eyes: EOM are normal.  Cardiovascular: Intact distal pulses.  Respiratory: Effort normal.  Musculoskeletal:  R UE pain with decrease ROM and weakness. NVID.  Neurological: She is alert and oriented to person, place, and time.  Skin: Skin is warm and dry.  Psychiatric: She has a normal mood and affect.     Assessment/Plan R shoulder rotator cuff tear arthropathy failed nonop management. Plan R reverse TSA Risks / benefits of surgery discussed Consent on chart  NPO for OR Preop antibiotics    [name], MD 01/18/2017, 7:05 AM

## 2017-01-18 NOTE — Op Note (Signed)
Procedure(s): REVERSE SHOULDER ARTHROPLASTY Procedure Note  Candice Nolan female 77 y.o. 01/18/2017  Procedure(s) and Anesthesia Type:    * RIGHT REVERSE SHOULDER ARTHROPLASTY - Choice   Indications:  77 y.o. female  With endstage right shoulder arthritis with irrepairable rotator cuff tear. Pain and dysfunction interfered with quality of life and nonoperative treatment with activity modification, NSAIDS and injections failed.     Surgeon: [name]   Assistants: Jeanmarie Hubert PA-C [facility] was present and scrubbed throughout the procedure and was essential in positioning, retraction, exposure, and closure)  Anesthesia: General endotracheal anesthesia with preoperative interscalene block given by the attending anesthesiologist     Procedure Detail  REVERSE SHOULDER ARTHROPLASTY   Estimated Blood Loss:  200 mL         Drains: none  Blood Given: none          Specimens: none        Complications:  * No complications entered in OR log *         Disposition: PACU - hemodynamically stable.         Condition: stable      OPERATIVE FINDINGS:  A DJO Altivate pressfit reverse total shoulder arthroplasty was placed with a  size 8 stem, a 32-4 glenosphere, and a standard poly insert. The base plate  fixation was excellent.  PROCEDURE: The patient was identified in the preoperative holding area  where I personally marked the operative site after verifying site, side,  and procedure with the patient. An interscalene block given by  the attending anesthesiologist in the holding area and the patient was taken back to the operating room where all extremities were  carefully padded in position after general anesthesia was induced. She  was placed in a beach-chair position and the operative upper extremity was  prepped and draped in a standard sterile fashion. The patient received 1 g IV tranexamic acid at the start of the case around time of the incision. An  approximately 10-  cm incision was made from the tip of the coracoid process to the center  point of the humerus at the level of the axilla. Dissection was carried  down through subcutaneous tissues to the level of the cephalic vein  which was taken laterally with the deltoid. The pectoralis major was  retracted medially. The subdeltoid space was developed and the lateral  edge of the conjoined tendon was identified. The undersurface of  conjoined tendon was palpated and the musculocutaneous nerve was not in  the field. Retractor was placed underneath the conjoined and second  retractor was placed lateral into the deltoid. The circumflex humeral  artery and vessels were identified and clamped and coagulated. The  biceps tendon was absent.  The subscapularis was taken down his appeal with the underlying capsule.  The  joint was then gently externally rotated while the capsule was released  from the humeral neck around to just beyond the 6 o'clock position. At  this point, the joint was dislocated and the humeral head was presented  into the wound. The excessive osteophyte formation was removed with a  large rongeur.  The cutting guide was used to make the appropriate  head cut and the head was saved for potentially bone grafting.  The glenoid was exposed with the arm in an  abducted extended position. The anterior and posterior labrum were  completely excised and the capsule was released circumferentially to  allow for exposure of the glenoid for preparation. The 2.5  mm drill was  placed using the guide in 5-10 inferior angulation and the tap was then advanced in the same hole. Small and large reamers were then used. The tap was then removed and the Metaglene was then screwed in with excellent purchase.  The peripheral guide was then used to drilled measured and filled peripheral locking screws.  The posterior hole was left unfilled.  The size 32-4 glenosphere was then impacted on the [facility]  taper and the central screw was placed. The humerus was then again exposed and the diaphyseal reamers were used followed by the metaphyseal reamers. The final broach was left in place in the proximal trial was placed. The joint was reduced and with this implant it was felt that soft tissue tensioning was appropriate with excellent stability and excellent range of motion. Therefore, final humeral stem was placed press-fit with bone grafting.  And then the trial polyethylene inserts were tested again and the above implant was felt to be the most appropriate for final insertion. The joint was reduced taken through full range of motion and felt to be stable. Soft tissue tension was appropriate.  The joint was then copiously irrigated with pulse  lavage and the wound was then closed. The subscapularis was not repaired.  Skin was closed with 2-0 Vicryl in a deep dermal layer and 4-0  Monocryl for skin closure. Steri-Strips were applied. Sterile  dressings were then applied as well as a sling. The patient was allowed  to awaken from general anesthesia, transferred to stretcher, and taken  to recovery room in stable condition.   POSTOPERATIVE PLAN: The patient will be kept in the hospital postoperatively  for pain control and therapy.

## 2017-01-18 NOTE — Anesthesia Postprocedure Evaluation (Signed)
Anesthesia Post Note  Patient: Candice Nolan  Procedure(s) Performed: REVERSE SHOULDER ARTHROPLASTY (Right Shoulder)     Patient location during evaluation: PACU Anesthesia Type: General and Regional Level of consciousness: awake and alert Pain management: pain level controlled Vital Signs Assessment: post-procedure vital signs reviewed and stable Respiratory status: spontaneous breathing, nonlabored ventilation, respiratory function stable and patient connected to nasal cannula oxygen Cardiovascular status: blood pressure returned to baseline and stable Postop Assessment: no apparent nausea or vomiting Anesthetic complications: no    Last Vitals:  Vitals:   01/18/17 1250 01/18/17 1301  BP:    Pulse:    Resp: (!) 22   Temp: (!) 36.3 C   SpO2: 96% 100%    Last Pain:  Vitals:   01/18/17 1301  TempSrc:   PainSc: Asleep                 Hiliary Osorto,JAMES TERRILL

## 2017-01-18 NOTE — Anesthesia Procedure Notes (Addendum)
Anesthesia Regional Block: Interscalene brachial plexus block   Pre-Anesthetic Checklist: ,, timeout performed, Correct Patient, Correct Site, Correct Laterality, Correct Procedure, Correct Position, site marked, Risks and benefits discussed,  Surgical consent,  Pre-op evaluation,  At surgeon's request and post-op pain management  Laterality: Upper and Right  Prep: chloraprep, alcohol swabs       Needles:  Injection technique: Single-shot  Needle Type: Echogenic Stimulator Needle     Needle Length: 4cm  Needle Gauge: 21   Needle insertion depth: 2 cm   Additional Needles:   Procedures:, nerve stimulator,,,,,,,   Nerve Stimulator or Paresthesia:  Response: Twitch elicited, 0.5 mA, 0.3 ms,   Additional Responses:   Narrative:  Start time: 01/18/2017 6:55 AM End time: 01/18/2017 7:10 AM Injection made incrementally with aspirations every 5 mL.  Performed by: Personally  Anesthesiologist: Rica Koyanagi, MD  Additional Notes: Block assessed prior to start of surgery

## 2017-01-18 NOTE — Transfer of Care (Signed)
Immediate Anesthesia Transfer of Care Note  Patient: Candice Nolan  Procedure(s) Performed: REVERSE SHOULDER ARTHROPLASTY (Right Shoulder)  Patient Location: PACU  Anesthesia Type:GA combined with regional for post-op pain  Level of Consciousness: awake, alert  and oriented  Airway & Oxygen Therapy: Patient Spontanous Breathing and Patient connected to nasal cannula oxygen  Post-op Assessment: Report given to RN and Post -op Vital signs reviewed and stable  Post vital signs: Reviewed and stable  Last Vitals:  Vitals:   01/18/17 0546 01/18/17 0929  BP: (!) 139/54   Pulse: 66 (P) 89  Resp: 18 (P) 16  Temp: 36.5 C (P) 36.7 C  SpO2: 100% (P) 100%    Last Pain:  Vitals:   01/18/17 0546  TempSrc: Oral      Patients Stated Pain Goal: 0 (38/[id number]/73 [id number])  Complications: No apparent anesthesia complications

## 2017-01-19 ENCOUNTER — Encounter (HOSPITAL_COMMUNITY): Payer: Self-pay | Admitting: Orthopedic Surgery

## 2017-01-19 LAB — CBC
HEMATOCRIT: 36.5 % (ref 36.0–46.0)
HEMOGLOBIN: 11.5 g/dL — AB (ref 12.0–15.0)
MCH: 28.8 pg (ref 26.0–34.0)
MCHC: 31.5 g/dL (ref 30.0–36.0)
MCV: 91.3 fL (ref 78.0–100.0)
PLATELETS: 258 10*3/uL (ref 150–400)
RBC: 4 MIL/uL (ref 3.87–5.11)
RDW: 13.5 % (ref 11.5–15.5)
WBC: 12 10*3/uL — AB (ref 4.0–10.5)

## 2017-01-19 LAB — BASIC METABOLIC PANEL
Anion gap: 12 (ref 5–15)
BUN: 12 mg/dL (ref 6–20)
CALCIUM: 9 mg/dL (ref 8.9–10.3)
CO2: 22 mmol/L (ref 22–32)
CREATININE: 0.67 mg/dL (ref 0.44–1.00)
Chloride: 107 mmol/L (ref 101–111)
GFR calc Af Amer: >60 mL/min (ref >60)
GLUCOSE: 98 mg/dL (ref 65–99)
POTASSIUM: 3.3 mmol/L — AB (ref 3.5–5.1)
SODIUM: 141 mmol/L (ref 135–145)

## 2017-01-19 NOTE — Evaluation (Signed)
Occupational Therapy Evaluation Patient Details Name: Candice Nolan MRN: [id number] DOB: 07-16-1940 Today's Date: 01/19/2017    History of Present Illness Pt is a 77 y/o F now s/p reverse R TSA    Clinical Impression   This 77 y/o F presents with the above. At baseline Pt is independent and reports being active. Pt lives with spouse who is able to assist after return home with ADLs/iADLs PRN. Pt presenting with decreased functional performance secondary to RUE functional limitations. Reviewed and educated Pt/[name] spouse on shoulder protocol/precautions, safety and compensatory techniques for completing ADLs while adhering to precautions. Pt currently requires min-ModA for UB ADLs, MinA for LB ADLs, completing functional mobility with MinGuard assist without AD. Pt required frequent verbal cues this session for adhering to shoulder precautions during mobility and task completion. Will benefit from additional session prior to return home to maximize her overall understanding of precautions, safety and independence with ADLs and mobility.     Follow Up Recommendations  DC plan and follow up therapy as arranged by surgeon;Supervision/Assistance - 24 hour    Equipment Recommendations  None recommended by OT           Precautions / Restrictions Precautions Precautions: Shoulder Type of Shoulder Precautions: Conservative Protocol: sling on at all times except ADL/exercise; NWB operative UE; okay for AROM to e/w/h to tolerance; No A/PROM to operative shoulder  Shoulder Interventions: Shoulder sling/immobilizer;At all times;Off for dressing/bathing/exercises Precaution Booklet Issued: Yes (comment) Precaution Comments: issued and thoroughly reviewed with Pt  Required Braces or Orthoses: Sling Restrictions Weight Bearing Restrictions: Yes RUE Weight Bearing: Non weight bearing      Mobility Bed Mobility Overal bed mobility: Needs Assistance Bed Mobility: Supine to Sit     Supine to  sit: Supervision;HOB elevated     General bed mobility comments: HOB elevated, supervision for safety   Transfers Overall transfer level: Modified independent               General transfer comment: supervision for safety, no physical assist needed     Balance Overall balance assessment: No apparent balance deficits (not formally assessed)                                         ADL either performed or assessed with clinical judgement   ADL Overall ADL's : Needs assistance/impaired Eating/Feeding: Set up;Sitting   Grooming: Supervision/safety;Sitting;Standing   Upper Body Bathing: Min guard;Sitting   Lower Body Bathing: Sit to/from stand;Min guard Lower Body Bathing Details (indicate cue type and reason): educated Pt to complete bathing from sit<>stand level for increased safety during task completion with Pt verbalizing understanding  Upper Body Dressing : Minimal assistance;Sitting;Cueing for safety Upper Body Dressing Details (indicate cue type and reason): minA for donning sling; verbal cues for safety  Lower Body Dressing: Minimal assistance;Sit to/from stand   Toilet Transfer: Min guard;Ambulation;Regular Museum/gallery exhibitions officer and Hygiene: Min guard;Sit to/from stand       Functional mobility during ADLs: Min guard General ADL Comments: reviewed and educated on shoulder precautions, safety and compensatory techniques for completing ADLs; Pt requires cues throughout session to adhere to precautions     Vision Baseline Vision/History: Wears glasses                  Pertinent Vitals/Pain Pain Assessment: Faces Faces Pain Scale: Hurts little more Pain Location: R shoulder  Pain Descriptors / Indicators: Sore;Aching Pain Intervention(s): Limited activity within patient's tolerance;Monitored during session;Ice applied     Hand Dominance Left   Extremity/Trunk Assessment Upper Extremity Assessment Upper Extremity  Assessment: RUE deficits/detail RUE Deficits / Details: s/p reverse TSA RUE: Unable to fully assess due to immobilization   Lower Extremity Assessment Lower Extremity Assessment: Overall WFL for tasks assessed       Communication Communication Communication: No difficulties   Cognition Arousal/Alertness: Awake/alert Behavior During Therapy: WFL for tasks assessed/performed Overall Cognitive Status: Within Functional Limits for tasks assessed                                 General Comments: Pt is fast moving, requires verbal safety cues to adhere to precautions, though feel this is [name] baseline (vs cognitive deficit) as Pt is typically independent and active    General Comments       Exercises Shoulder Exercises Elbow Flexion: AROM;10 reps;Right;Seated Elbow Extension: AROM;10 reps;Right;Seated Wrist Flexion: AROM;10 reps;Right;Seated Wrist Extension: Right;Seated;10 reps;AROM Digit Composite Flexion: AROM;10 reps;Right;Seated Composite Extension: AROM;10 reps;Right;Seated Neck Flexion: AROM;Seated Neck Extension: AROM;Seated Neck Lateral Flexion - Right: AROM;Seated Neck Lateral Flexion - Left: AROM;Seated Hand Exercises Forearm Supination: AROM;10 reps;Right;Seated Forearm Pronation: AROM;Right;10 reps;Seated   Shoulder Instructions Shoulder Instructions Donning/doffing shirt without moving shoulder: Moderate assistance Method for sponge bathing under operated UE: Min-guard Donning/doffing sling/immobilizer: Minimal assistance;Caregiver independent with task;Patient able to independently direct caregiver Correct positioning of sling/immobilizer: Min-guard ROM for elbow, wrist and digits of operated UE: Min-guard Sling wearing schedule (on at all times/off for ADL's): Supervision/safety Proper positioning of operated UE when showering: Min-guard Positioning of UE while sleeping: Minimal assistance    Home Living Family/patient expects to be discharged  to:: Private residence Living Arrangements: Spouse/significant other Available Help at Discharge: Family;Available 24 hours/day Type of Home: Apartment       Home Layout: One level     Bathroom Shower/Tub: Tub/shower unit;Walk-in Psychologist, prison and probation services: Standard     Home Equipment: Shower seat - built in          Prior Functioning/Environment Level of Independence: Independent                 OT Problem List: Decreased strength;Decreased activity tolerance;Impaired UE functional use;Decreased knowledge of precautions;Pain      OT Treatment/Interventions: DME and/or AE instruction;Therapeutic activities;Self-care/ADL training;Balance training;Therapeutic exercise;Energy conservation;Patient/family education    OT Goals(Current goals can be found in the care plan section) Acute Rehab OT Goals Patient Stated Goal: to play water volleyball again  OT Goal Formulation: With patient Time For Goal Achievement: 02/02/17 Potential to Achieve Goals: Good  OT Frequency: Min 2X/week                             AM-PAC PT "6 Clicks" Daily Activity     Outcome Measure Help from another person eating meals?: None Help from another person taking care of personal grooming?: A Little Help from another person toileting, which includes using toliet, bedpan, or urinal?: A Little Help from another person bathing (including washing, rinsing, drying)?: A Little Help from another person to put on and taking off regular upper body clothing?: A Lot Help from another person to put on and taking off regular lower body clothing?: A Little 6 Click Score: 18   End of Session Equipment Utilized During Treatment: Other (comment)(sling )  Nurse Communication: Mobility status  Activity Tolerance: Patient tolerated treatment well Patient left: in chair;with call bell/phone within reach  OT Visit Diagnosis: Muscle weakness (generalized) (M62.81);Other (comment)(s/p reverse R TSA )                 Time: 4782-9562 OT Time Calculation (min): 40 min Charges:  OT General Charges $OT Visit: 1 Visit OT Evaluation $OT Eval Low Complexity: 1 Low OT Treatments $Self Care/Home Management : 8-22 mins G-Codes:     Lou Cal, OT Pager [phone] 01/19/2017   Raymondo Band 01/19/2017, 9:18 AM

## 2017-01-19 NOTE — Discharge Summary (Signed)
Patient ID: Candice Nolan MRN: [id number] DOB/AGE: 08-18-1940 77 y.o.  Admit date: 01/18/2017 Discharge date: 01/19/2017  Admission Diagnoses:  Active Problems:   S/P reverse total shoulder arthroplasty, right   Discharge Diagnoses:  Same  Past Medical History:  Diagnosis Date  . Anxiety   . Arthritis   . 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 Cancer ([place])    basal cell skin cancer removed  . Complication of anesthesia    elevated temp post-op '70, no problems since. Received Suprane and sccinylcholine 03/15/09 w/o issue.   . Depression   . GERD (gastroesophageal reflux disease)   . H/O bladder infections   . Pneumonia    as a child    Surgeries: Procedure(s): REVERSE SHOULDER ARTHROPLASTY on 01/18/2017   Consultants:   Discharged Condition: Improved  Hospital Course: Elodie Panameno Anzalone is an 77 y.o. female who was admitted 01/18/2017 for operative treatment of right shoulder irreparable RCT/arthritis. Patient has severe unremitting pain that affects sleep, daily activities, and work/hobbies. After pre-op clearance the patient was taken to the operating room on 01/18/2017 and underwent  Procedure(s): [place].    Patient was given perioperative antibiotics:  Anti-infectives (From admission, onward)   Start     Dose/Rate Route Frequency Ordered Stop   01/18/17 1400  ceFAZolin (ANCEF) IVPB 1 g/50 mL premix     1 g 100 mL/hr over 30 Minutes Intravenous Every 6 hours 01/18/17 1306 01/19/17 0635   01/18/17 0526  ceFAZolin (ANCEF) IVPB 2g/100 mL premix     2 g 200 mL/hr over 30 Minutes Intravenous On call to O.R. 01/18/17 4098 01/18/17 0744       Patient was given sequential compression devices, early ambulation, and asa to prevent DVT.  Patient benefited maximally from hospital stay and there were no complications.    Recent vital signs:  Patient Vitals for the past 24 hrs:  BP Temp Temp src Pulse Resp SpO2  01/19/17  0649 (!) 112/48 98.8 F (37.1 C) Oral 71 16 97 %  01/18/17 2031 (!) 106/43 98.4 F (36.9 C) Oral 70 17 98 %  01/18/17 1700 (!) 121/41 98.5 F (36.9 C) Oral 70 18 99 %  01/18/17 1301 (!) 122/58 98 F (36.7 C) Oral 88 18 100 %  01/18/17 1250 - (!) 97.3 F (36.3 C) - - (!) 22 96 %     Recent laboratory studies:  Recent Labs    01/19/17 0937  WBC 12.0*  HGB 11.5*  HCT 36.5  PLT 258  NA 141  K 3.3*  CL 107  CO2 22  BUN 12  CREATININE 0.67  GLUCOSE 98  CALCIUM 9.0     Discharge Medications:   Allergies as of 01/19/2017      Reactions   Ascorbate Swelling   facial   Contrast Media [iodinated Diagnostic Agents] Shortness Of Breath   Iodine Shortness Of Breath   Chloramphenicol    UNSPECIFIED REACTION    Pentazocine Anxiety   Tyloxapol Anxiety      Medication List    STOP taking these medications   HYDROcodone-acetaminophen 5-325 MG tablet Commonly known as:  NORCO/VICODIN   meloxicam 15 MG tablet Commonly known as:  MOBIC   naproxen sodium 220 MG tablet Commonly known as:  ALEVE     TAKE these medications   ALPRAZolam 0.25 MG tablet Commonly known as:  XANAX Take 0.25 mg by mouth as needed for anxiety.   CALCIUM-VITAMIN D PO  Take 1 tablet by mouth daily.   Cranberry 500 MG Caps Take 500 mg by mouth daily.   EYLEA IO Inject into the eye See admin instructions. Every 8-10 weeks   loperamide 2 MG capsule Commonly known as:  IMODIUM Take 2 mg by mouth as needed for diarrhea or loose stools.   methylphenidate 30 MG 24 hr capsule Commonly known as:  RITALIN LA Take 30 mg by mouth every morning.   multivitamin with minerals Tabs tablet Take 1 tablet by mouth daily.   omeprazole 20 MG capsule Commonly known as:  PRILOSEC Take 20 mg by mouth daily.   oxyCODONE-acetaminophen 5-325 MG tablet Commonly known as:  ROXICET Take 1-2 tablets by mouth every 4 (four) hours as needed for severe pain.   PRESERVISION AREDS 2+MULTI VIT Caps Take 1 tablet by  mouth 2 (two) times daily.   PROBIOTIC PO Take 1 tablet by mouth every evening.       Diagnostic Studies: Dg Chest 2 View  Result Date: 01/11/2017 CLINICAL DATA:  PRE ADMIT FOR RT SHOULDER REPLACEMENT ,NO CARDIAC HX EXAM: CHEST  2 VIEW COMPARISON:  03/10/2009 FINDINGS: Heart size is normal. Lungs are clear. No pulmonary edema. There midthoracic spondylosis. Postoperative changes are seen in the right shoulder. There is subacromial narrowing associated with degenerative changes of the left shoulder. IMPRESSION: No evidence for acute cardiopulmonary abnormality. Electronically Signed   By: Nolon Nations M.D.   On: 01/11/2017 12:03   Dg Shoulder Right Port  Result Date: 01/18/2017 CLINICAL DATA:  Reverse shoulder arthroplasty EXAM: PORTABLE RIGHT SHOULDER COMPARISON:  Chest x-ray of 01/11/2017 FINDINGS: A single portable view of the right shoulder shows reverse right shoulder replacement components in good position. No complicating features are seen. Degenerative change of the right [facility] joint is noted. IMPRESSION: Reverse right shoulder replacement with no complicating features. Electronically Signed   By: Ivar Drape M.D.   On: 01/18/2017 10:14    Disposition:   Discharge Instructions    Call MD / Call 911   Complete by:  As directed    If you experience chest pain or shortness of breath, CALL 911 and be transported to the hospital emergency room.  If you develope a fever above 101 F, pus (white drainage) or increased drainage or redness at the wound, or calf pain, call your surgeon's office.   Call MD / Call 911   Complete by:  As directed    If you experience chest pain or shortness of breath, CALL 911 and be transported to the hospital emergency room.  If you develope a fever above 101 F, pus (white drainage) or increased drainage or redness at the wound, or calf pain, call your surgeon's office.   Constipation Prevention   Complete by:  As directed    Drink plenty of fluids.  Prune  juice may be helpful.  You may use a stool softener, such as Colace (over the counter) 100 mg twice a day.  Use MiraLax (over the counter) for constipation as needed.   Constipation Prevention   Complete by:  As directed    Drink plenty of fluids.  Prune juice may be helpful.  You may use a stool softener, such as Colace (over the counter) 100 mg twice a day.  Use MiraLax (over the counter) for constipation as needed.   Diet - low sodium heart healthy   Complete by:  As directed    Diet - low sodium heart healthy   Complete by:  As directed    Increase activity slowly as tolerated   Complete by:  As directed    Increase activity slowly as tolerated   Complete by:  As directed       Follow-up Information    Tania Ade, MD. Schedule an appointment as soon as possible for a visit in 2 week(s).   Specialty:  Orthopedic Surgery Contact information: [address] [place] [address] [phone]            Signed: Grier Mitts 01/19/2017, 12:01 PM

## 2017-01-19 NOTE — Discharge Instructions (Signed)

## 2017-01-19 NOTE — Progress Notes (Signed)
Discharge instructions RX's andn follow up  appts explained and provided to patient verbalized understanding.  Kermitt Harjo, Tivis Ringer, RN

## 2017-01-19 NOTE — Progress Notes (Signed)
Occupational Therapy Treatment Patient Details Name: Candice Nolan MRN: [id number] DOB: [birth year] Today's Date: 01/19/2017    History of present illness Pt is a 77 y/o F now s/p reverse R TSA    OT comments  Pt progressing towards goals. Completed UB/LB dressing with MinGuard assist for LB dressing, MinA to complete UB dressing with spouse providing assist PRN and therapist providing verbal cues throughout for technique/safety. Pt continues to require cues for adhering to shoulder precautions, though is aware and receptive to education provided. Spouse also present for education. Questions answered throughout. Feel Pt is safe to return home with spouse assist PRN for ADL completion and recommend follow-up as arranged per MD for therapy progression. Pt anticipating d/c home later today.   Follow Up Recommendations  DC plan and follow up therapy as arranged by surgeon;Supervision/Assistance - 24 hour    Equipment Recommendations  None recommended by OT          Precautions / Restrictions Precautions Precautions: Shoulder Type of Shoulder Precautions: Conservative Protocol: sling on at all times except ADL/exercise; NWB operative UE; okay for AROM to e/w/h to tolerance; No A/PROM to operative shoulder  Shoulder Interventions: Shoulder sling/immobilizer;At all times;Off for dressing/bathing/exercises Precaution Comments: reviewed with Pt  Required Braces or Orthoses: Sling Restrictions Weight Bearing Restrictions: Yes RUE Weight Bearing: Non weight bearing       Mobility Bed Mobility Overal bed mobility: Needs Assistance Bed Mobility: Supine to Sit     Supine to sit: Supervision;HOB elevated     General bed mobility comments: HOB elevated, supervision for safety   Transfers Overall transfer level: Modified independent               General transfer comment: supervision for safety, no physical assist needed     Balance Overall balance assessment: No apparent  balance deficits (not formally assessed)                                         ADL either performed or assessed with clinical judgement   ADL Overall ADL's : Needs assistance/impaired                 Upper Body Dressing : Minimal assistance;Sitting;Cueing for safety;With caregiver independent assisting Upper Body Dressing Details (indicate cue type and reason): donning overhead shirt and sling; caregiver assisting with MinA and min verbal cues for safe technique  Lower Body Dressing: Min guard;Sit to/from stand Lower Body Dressing Details (indicate cue type and reason): donning pants and underwear             Functional mobility during ADLs: Min guard General ADL Comments: further review and education on shoulder precautions/protocol; Pt completing UB/LB dressing with verbal cues for adhering to precautions during task completion                       Cognition Arousal/Alertness: Awake/alert Behavior During Therapy: [facility] for tasks assessed/performed Overall Cognitive Status: Within Functional Limits for tasks assessed                                 General Comments: continues to require cues for safety and following precautions, as well as to take her time during ADL completion as Pt is generally fast moving at baseline; Pt understanding of education provided  Shoulder Instructions Shoulder Instructions Donning/doffing shirt without moving shoulder: Moderate assistance;Caregiver independent with task;Patient able to independently direct caregiver Method for sponge bathing under operated UE: Min-guard Donning/doffing sling/immobilizer: Minimal assistance;Caregiver independent with task;Patient able to independently direct caregiver Correct positioning of sling/immobilizer: Min-guard     General Comments      Pertinent Vitals/ Pain       Pain Assessment: Faces Faces Pain Scale: Hurts little more Pain Location: R  shoulder  Pain Descriptors / Indicators: Sore;Aching Pain Intervention(s): Limited activity within patient's tolerance;Monitored during session                                                          Frequency  Min 2X/week        Progress Toward Goals  OT Goals(current goals can now be found in the care plan section)  Progress towards OT goals: Progressing toward goals  Acute Rehab OT Goals Patient Stated Goal: to play water volleyball again  OT Goal Formulation: With patient Time For Goal Achievement: 02/02/17 Potential to Achieve Goals: Good  Plan Discharge plan remains appropriate                     AM-PAC PT "6 Clicks" Daily Activity     Outcome Measure   Help from another person eating meals?: None Help from another person taking care of personal grooming?: A Little Help from another person toileting, which includes using toliet, bedpan, or urinal?: A Little Help from another person bathing (including washing, rinsing, drying)?: A Little Help from another person to put on and taking off regular upper body clothing?: A Lot Help from another person to put on and taking off regular lower body clothing?: A Little 6 Click Score: 18    End of Session Equipment Utilized During Treatment: Other (comment)(sling )  OT Visit Diagnosis: Muscle weakness (generalized) (M62.81);Other (comment)(s/p reverse R TSA)   Activity Tolerance Patient tolerated treatment well   Patient Left Other (comment);with call bell/phone within reach;with nursing/sitter in room;with family/visitor present(standing in room with RN )   Nurse Communication Mobility status        Time: 0109-3235 OT Time Calculation (min): 28 min  Charges: OT General Charges $OT Visit: 1 Visit OT Treatments $Self Care/Home Management : 23-37 mins  Candice Nolan, OT Pager [phone] 01/19/2017    Candice Nolan 01/19/2017, 1:55 PM

## 2017-01-19 NOTE — Progress Notes (Signed)
   PATIENT ID: Candice Nolan   1 Day Post-Op Procedure(s) (LRB): REVERSE SHOULDER ARTHROPLASTY (Right)  Subjective: Doing well, no pain the right shoulder, feels like block has worn off. No complaints.   Objective:  Vitals:   01/18/17 2031 01/19/17 0649  BP: (!) 106/43 (!) 112/48  Pulse: 70 71  Resp: 17 16  Temp: 98.4 F (36.9 C) 98.8 F (37.1 C)  SpO2: 98% 97%     R shoulder dressing c/d/i Wiggles fingers, distally NVI  Labs:  Recent Labs    01/19/17 0937  HGB 11.5*   Recent Labs    01/19/17 0937  WBC 12.0*  RBC 4.00  HCT 36.5  PLT 258   Recent Labs    01/19/17 0937  NA 141  K 3.3*  CL 107  CO2 22  BUN 12  CREATININE 0.67  GLUCOSE 98  CALCIUM 9.0    Assessment and Plan: 1 day s/p R reverse TSA OT- hand, wrist elbow ROM Scripts signed in chart Fu in 2 weeks D/c when cleared by OT today  VTE proph: asa, scds

## 2017-01-21 NOTE — Progress Notes (Signed)
  OT Contact Note  Patient Details Name: Candice Nolan MRN: [id number] DOB: 07-Jun-1940  Pt called [facility] OT department 01/20/17 at approximately 16:00 to notify that she had misplaced her shoulder protocol and HEP documents given by Lou Cal, OTR/L 01/19/17. Spoke with this Probation officer and provided e-mail address as follows: [email or web address]. E-mailed Occupational Therapy Shoulder Protocol as well as Elbow AROM HEP per MD post-operative protocol 01/20/17 at 19:02. Thank you!  Norman Herrlich, MS OTR/L  Pager: [phone]   Norman Herrlich 01/21/2017, 7:35 AM

## 2018-07-18 ENCOUNTER — Other Ambulatory Visit: Payer: Self-pay | Admitting: Orthopedic Surgery

## 2018-08-08 NOTE — Patient Instructions (Addendum)
YOU NEED TO HAVE A COVID 19 TEST ON_8-10-20______ @_______ , THIS TEST MUST BE DONE BEFORE SURGERY, COME  [address] [place] , [address]. ONCE YOUR COVID TEST IS COMPLETED, PLEASE BEGIN THE QUARANTINE INSTRUCTIONS AS OUTLINED IN YOUR HANDOUT.                Candice Nolan  08/08/2018   Your procedure is scheduled on: 08-15-18   Report to [facility] Main  Entrance             Report to admitting at     0530  AM   [place] WAIT IN WAITING ROOM  ONLY DAY OF YOUR SURGERY.    Call this number if you have problems the morning of surgery [phone]    Remember: . NO SOLID FOOD AFTER MIDNIGHT THE NIGHT PRIOR TO SURGERY. NOTHING BY MOUTH EXCEPT CLEAR LIQUIDS UNTIL 3 HOURS PRIOR TO [place] SURGERY. PLEASE FINISH ENSURE DRINK PER SURGEON ORDER 3 HOURS PRIOR TO SCHEDULED SURGERY TIME WHICH NEEDS TO BE COMPLETED AT ___0430 am  Then nothing by mouth_________.     CLEAR LIQUID DIET   Foods Allowed                                                                     Foods Excluded  Coffee and tea, regular and decaf                             liquids that you cannot  Plain Jell-O any favor except red or purple                                           see through such as: Fruit ices (not with fruit pulp)                                     milk, soups, orange juice  Iced Popsicles                                    All solid food Carbonated beverages, regular and diet                                    Cranberry, grape and apple juices Sports drinks like Gatorade Lightly seasoned clear broth or consume(fat free) Sugar, honey syrup  _____________________________________________________________________      BRUSH YOUR TEETH MORNING OF SURGERY AND RINSE YOUR MOUTH OUT, NO CHEWING GUM CANDY OR MINTS.     Take these medicines the morning of surgery with A SIP OF WATER: omeprazole, pristiq                                You may not have any metal on  your body including hair pins and  piercings  Do not wear jewelry, , lotions, powders or perfumes, deodorant             Do not wear nail polish.  Do not shave  48 hours prior to surgery.            Do not bring valuables to the hospital. [place].  Contacts, dentures or bridgework may not be worn into surgery.               Please read over the following fact sheets you were given: _____________________________________________________________________           [facility] - Preparing for Surgery Before surgery, you can play an important role.  Because skin is not sterile, your skin needs to be as free of germs as possible.  You can reduce the number of germs on your skin by washing with CHG (chlorahexidine gluconate) soap before surgery.  CHG is an antiseptic cleaner which kills germs and bonds with the skin to continue killing germs even after washing. Please DO NOT use if you have an allergy to CHG or antibacterial soaps.  If your skin becomes reddened/irritated stop using the CHG and inform your nurse when you arrive at Short Stay. Do not shave (including legs and underarms) for at least 48 hours prior to the first CHG shower.  You may shave your face/neck. Please follow these instructions carefully:  1.  Shower with CHG Soap the night before surgery and the  morning of Surgery.  2.  If you choose to wash your hair, wash your hair first as usual with your  normal  shampoo.  3.  After you shampoo, rinse your hair and body thoroughly to remove the  shampoo.                           4.  Use CHG as you would any other liquid soap.  You can apply chg directly  to the skin and wash                       Gently with a scrungie or clean washcloth.  5.  Apply the CHG Soap to your body ONLY FROM THE NECK DOWN.   Do not use on face/ open                           Wound or open sores. Avoid contact with eyes, ears mouth and genitals  (private parts).                       Wash face,  Genitals (private parts) with your normal soap.             6.  Wash thoroughly, paying special attention to the area where your surgery  will be performed.  7.  Thoroughly rinse your body with warm water from the neck down.  8.  DO NOT shower/wash with your normal soap after using and rinsing off  the CHG Soap.                9.  Pat yourself dry with a clean towel.            10.  Wear clean pajamas.  11.  Place clean sheets on your bed the night of your first shower and do not  sleep with pets. Day of Surgery : Do not apply any lotions/deodorants the morning of surgery.  Please wear clean clothes to the hospital/surgery center.  FAILURE TO FOLLOW THESE INSTRUCTIONS MAY RESULT IN THE CANCELLATION OF YOUR SURGERY PATIENT SIGNATURE_________________________________  NURSE SIGNATURE__________________________________  ________________________________________________________________________   Candice Nolan  An incentive spirometer is a tool that can help keep your lungs clear and active. This tool measures how well you are filling your lungs with each breath. Taking long deep breaths may help reverse or decrease the chance of developing breathing (pulmonary) problems (especially infection) following:  A long period of time when you are unable to move or be active. BEFORE THE PROCEDURE   If the spirometer includes an indicator to show your best effort, your nurse or respiratory therapist will set it to a desired goal.  If possible, sit up straight or lean slightly forward. Try not to slouch.  Hold the incentive spirometer in an upright position. INSTRUCTIONS FOR USE  1. Sit on the edge of your bed if possible, or sit up as far as you can in bed or on a chair. 2. Hold the incentive spirometer in an upright position. 3. Breathe out normally. 4. Place the mouthpiece in your mouth and seal your lips tightly around  it. 5. Breathe in slowly and as deeply as possible, raising the piston or the ball toward the top of the column. 6. Hold your breath for 3-5 seconds or for as long as possible. Allow the piston or ball to fall to the bottom of the column. 7. Remove the mouthpiece from your mouth and breathe out normally. 8. Rest for a few seconds and repeat Steps 1 through 7 at least 10 times every 1-2 hours when you are awake. Take your time and take a few normal breaths between deep breaths. 9. The spirometer may include an indicator to show your best effort. Use the indicator as a goal to work toward during each repetition. 10. After each set of 10 deep breaths, practice coughing to be sure your lungs are clear. If you have an incision (the cut made at the time of surgery), support your incision when coughing by placing a pillow or rolled up towels firmly against it. Once you are able to get out of bed, walk around indoors and cough well. You may stop using the incentive spirometer when instructed by your caregiver.  RISKS AND COMPLICATIONS  Take your time so you do not get dizzy or light-headed.  If you are in pain, you may need to take or ask for pain medication before doing incentive spirometry. It is harder to take a deep breath if you are having pain. AFTER USE  Rest and breathe slowly and easily.  It can be helpful to keep track of a log of your progress. Your caregiver can provide you with a simple table to help with this. If you are using the spirometer at home, follow these instructions: [place] IF:   You are having difficultly using the spirometer.  You have trouble using the spirometer as often as instructed.  Your pain medication is not giving enough relief while using the spirometer.  You develop fever of 100.5 F (38.1 C) or higher. SEEK IMMEDIATE MEDICAL CARE IF:   You cough up bloody sputum that had not been present before.  You develop fever of 102 F (38.9 C) or  greater.  You develop worsening pain at or near the incision site. MAKE SURE YOU:   Understand these instructions.  Will watch your condition.  Will get help right away if you are not doing well or get worse. Document Released: 05/01/2006 Document Revised: 03/13/2011 Document Reviewed: 07/02/2006 [facility] Patient Information 2014 [place], [place].   ________________________________________________________________________

## 2018-08-09 ENCOUNTER — Encounter (INDEPENDENT_AMBULATORY_CARE_PROVIDER_SITE_OTHER): Payer: Self-pay

## 2018-08-09 ENCOUNTER — Encounter (HOSPITAL_COMMUNITY): Payer: Self-pay

## 2018-08-09 ENCOUNTER — Ambulatory Visit (HOSPITAL_COMMUNITY)
Admission: RE | Admit: 2018-08-09 | Discharge: 2018-08-09 | Disposition: A | Discharge status: Home / Self Care | Payer: Medicare Other | Source: Ambulatory Visit | Attending: Orthopedic Surgery | Admitting: Orthopedic Surgery

## 2018-08-09 ENCOUNTER — Other Ambulatory Visit: Payer: Self-pay

## 2018-08-09 ENCOUNTER — Encounter (HOSPITAL_COMMUNITY)
Admission: RE | Admit: 2018-08-09 | Discharge: 2018-08-09 | Disposition: A | Discharge status: Home / Self Care | Payer: Medicare Other | Source: Ambulatory Visit | Attending: Orthopedic Surgery | Admitting: Orthopedic Surgery

## 2018-08-09 DIAGNOSIS — Z7982 Long term (current) use of aspirin: Secondary | ICD-10-CM | POA: Insufficient documentation

## 2018-08-09 DIAGNOSIS — M75102 Unspecified rotator cuff tear or rupture of left shoulder, not specified as traumatic: Secondary | ICD-10-CM | POA: Insufficient documentation

## 2018-08-09 DIAGNOSIS — F419 Anxiety disorder, unspecified: Secondary | ICD-10-CM | POA: Diagnosis not present

## 2018-08-09 DIAGNOSIS — Z79899 Other long term (current) drug therapy: Secondary | ICD-10-CM | POA: Diagnosis not present

## 2018-08-09 DIAGNOSIS — M19012 Primary osteoarthritis, left shoulder: Secondary | ICD-10-CM | POA: Diagnosis not present

## 2018-08-09 DIAGNOSIS — Z01811 Encounter for preprocedural respiratory examination: Secondary | ICD-10-CM

## 2018-08-09 DIAGNOSIS — Z85828 Personal history of other malignant neoplasm of skin: Secondary | ICD-10-CM | POA: Insufficient documentation

## 2018-08-09 DIAGNOSIS — Z01818 Encounter for other preprocedural examination: Secondary | ICD-10-CM | POA: Diagnosis not present

## 2018-08-09 DIAGNOSIS — K219 Gastro-esophageal reflux disease without esophagitis: Secondary | ICD-10-CM | POA: Diagnosis not present

## 2018-08-09 HISTORY — DX: Unspecified urinary incontinence: R32

## 2018-08-09 HISTORY — DX: Unspecified macular degeneration: H35.30

## 2018-08-09 LAB — COMPREHENSIVE METABOLIC PANEL
ALT: 18 U/L (ref 0–44)
AST: 22 U/L (ref 15–41)
Albumin: 4.5 g/dL (ref 3.5–5.0)
Alkaline Phosphatase: 68 U/L (ref 38–126)
Anion gap: 10 (ref 5–15)
BUN: 23 mg/dL (ref 8–23)
CO2: 26 mmol/L (ref 22–32)
Calcium: 9.5 mg/dL (ref 8.9–10.3)
Chloride: 103 mmol/L (ref 98–111)
Creatinine, Ser: 0.63 mg/dL (ref 0.44–1.00)
GFR calc Af Amer: >60 mL/min (ref >60)
GFR calc non Af Amer: >60 mL/min (ref >60)
Glucose, Bld: 83 mg/dL (ref 70–99)
Potassium: 4.3 mmol/L (ref 3.5–5.1)
Sodium: 139 mmol/L (ref 135–145)
Total Bilirubin: 0.8 mg/dL (ref 0.3–1.2)
Total Protein: 7.5 g/dL (ref 6.5–8.1)

## 2018-08-09 LAB — SURGICAL PCR SCREEN
MRSA, PCR: NEGATIVE
Staphylococcus aureus: NEGATIVE

## 2018-08-09 LAB — CBC WITH DIFFERENTIAL/PLATELET
Abs Immature Granulocytes: 0.02 10*3/uL (ref 0.00–0.07)
Basophils Absolute: 0.1 10*3/uL (ref 0.0–0.1)
Basophils Relative: 1 %
Eosinophils Absolute: 0.1 10*3/uL (ref 0.0–0.5)
Eosinophils Relative: 1 %
HCT: 38.3 % (ref 36.0–46.0)
Hemoglobin: 12.1 g/dL (ref 12.0–15.0)
Immature Granulocytes: 0 %
Lymphocytes Relative: 17 %
Lymphs Abs: 1.3 10*3/uL (ref 0.7–4.0)
MCH: 29.7 pg (ref 26.0–34.0)
MCHC: 31.6 g/dL (ref 30.0–36.0)
MCV: 94.1 fL (ref 80.0–100.0)
Monocytes Absolute: 0.5 10*3/uL (ref 0.1–1.0)
Monocytes Relative: 6 %
Neutro Abs: 6.1 10*3/uL (ref 1.7–7.7)
Neutrophils Relative %: 75 %
Platelets: 275 10*3/uL (ref 150–400)
RBC: 4.07 MIL/uL (ref 3.87–5.11)
RDW: 13.1 % (ref 11.5–15.5)
WBC: 8.1 10*3/uL (ref 4.0–10.5)
nRBC: 0 % (ref 0.0–0.2)

## 2018-08-09 LAB — URINALYSIS, ROUTINE W REFLEX MICROSCOPIC
Bilirubin Urine: NEGATIVE
Glucose, UA: NEGATIVE mg/dL
Hgb urine dipstick: NEGATIVE
Ketones, ur: NEGATIVE mg/dL
Leukocytes,Ua: NEGATIVE
Nitrite: NEGATIVE
Protein, ur: NEGATIVE mg/dL
Specific Gravity, Urine: 1.005 (ref 1.005–1.030)
pH: 5 (ref 5.0–8.0)

## 2018-08-09 LAB — APTT: aPTT: 33 seconds (ref 24–36)

## 2018-08-09 LAB — PROTIME-INR
INR: 0.9 (ref 0.8–1.2)
Prothrombin Time: 12.2 seconds (ref 11.4–15.2)

## 2018-08-09 NOTE — Progress Notes (Signed)
Per pt. Request Dr. Valma Cava came to answer questions at preop.

## 2018-08-12 ENCOUNTER — Other Ambulatory Visit (HOSPITAL_COMMUNITY)
Admission: RE | Admit: 2018-08-12 | Discharge: 2018-08-12 | Disposition: A | Discharge status: Home / Self Care | Payer: Medicare Other | Source: Ambulatory Visit | Attending: Cardiology | Admitting: Cardiology

## 2018-08-12 DIAGNOSIS — Z01812 Encounter for preprocedural laboratory examination: Secondary | ICD-10-CM | POA: Insufficient documentation

## 2018-08-12 DIAGNOSIS — Z20828 Contact with and (suspected) exposure to other viral communicable diseases: Secondary | ICD-10-CM | POA: Insufficient documentation

## 2018-08-12 LAB — SARS CORONAVIRUS 2 (TAT 6-24 HRS): SARS Coronavirus 2: NEGATIVE

## 2018-08-13 NOTE — Progress Notes (Signed)
Anesthesia Chart Review  Case: [id number] Date/Time: 08/15/18 0715   Procedure: REVERSE TOTAL SHOULDER ARTHROPLASTY (Left Shoulder)   Anesthesia type: Choice   Pre-op diagnosis: LEFT SHOULDER ROTATOR CUFF TEAR ARTHROPATHY   Location: [facility] ROOM 07 / [facility]   Surgeon: Tania Ade, MD      DISCUSSION:78 y.o. never smoker with h/oH GERD, skin cancer excision ([place]), bladder augmentation (occasional I&O cath), anxiety, depression, arthritis, bilateral degenerative myopia with macular hold. 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 Scheduled for above procedure 08/15/2018 with Dr. Tania Ade.    Pt requested to speak with anesthesia at PAT visit.  Dr. Valma Cava spoke with pt at PAT.  S/p right reverse shoulder arthroplasty 01/18/2017.  At PAT visit prior to this procedure spoke with anesthesia about her concerns as well.  Anesthesia concerns outlined in note by Myra Gianotti on 01/11/2017.  Per note, "We also discussed her prior anesthesia history that included a reported elevated temperature after surgery in 1970 carpal tunnel surgery--was initially unsure if could have been malignant hyperthermia ([place]), but denied getting a letter from anesthesia or having to go to the ICU. She thinks she may have stayed overnight for observation. She has had multiple procedures since without incidence. She had left shoulder arthroscopy at the [facility] on 03/15/09 (Dr. Mayer Camel) and received Suprane/Ultane and succinylcholine with reaction.   If no acute changes then I anticipate that she can proceed as planned. Prior anesthesia history discussed with Dr. Tobias Alexander and he reviewed 03/15/09 anesthesia records. Her description of events in 1970 do not sound consistent with MH. She did not think she had ever been told she may have had MH reaction. Furthermore, she received triggering agents on 03/15/09 without incident, so no planned anesthesia restrictions at this point. Her assigned anesthesiologist will evaluate on the day of  surgery."  No anesthesia complications noted with procedure 01/18/17.  Anticipate pt can proceed with planned procedure barring acute status change.   VS: BP (!) 151/75   Pulse 71   Temp 37.1 C (Oral)   Resp 16   Ht 4\' 9"  (1.448 m)   Wt 66.2 kg   SpO2 97%   BMI 31.59 kg/m   PROVIDERS: Javier Glazier, MD   LABS: Labs reviewed: Acceptable for surgery. (all labs ordered are listed, but only abnormal results are displayed)  Labs Reviewed  URINALYSIS, ROUTINE W REFLEX MICROSCOPIC - Abnormal; Notable for the following components:      Result Value   Color, Urine STRAW (*)    All other components within normal limits  SURGICAL PCR SCREEN  APTT  CBC WITH DIFFERENTIAL/PLATELET  COMPREHENSIVE METABOLIC PANEL  PROTIME-INR     IMAGES: Chest Xray 08/09/2018 FINDINGS: The cardiomediastinal silhouette is unremarkable.  There is no evidence of focal airspace disease, pulmonary edema, suspicious pulmonary nodule/mass, pleural effusion, or pneumothorax.  No acute bony abnormalities are identified.  RIGHT shoulder arthroplasty and LEFT shoulder degenerative changes noted.  IMPRESSION: No active cardiopulmonary disease.  EKG: 08/09/2018 Rate 69 bpm Normal sinus rhythm  Normal ECG No significant change since last tracing   CV:  Past Medical History:  Diagnosis Date  . Anxiety   . Arthritis   . 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 Cancer ([place])    basal cell skin cancer removed  . Complication of anesthesia    elevated temp post-op '70, no problems since. Received Suprane and sccinylcholine 03/15/09 w/o issue.   . Depression   . GERD (  gastroesophageal reflux disease)   . H/O bladder infections   . Incontinence    at young age , and bowel as a young adult . Had bladder reconstruction and part of colon was used as top of bladder . Pt. self caths 4 x a day  . Macular degeneration   . Pneumonia    as a child    Past Surgical History:   Procedure Laterality Date  . ABDOMINAL HYSTERECTOMY    . APPENDECTOMY    . BICEPS TENDON REPAIR    . BLADDER AUGMENTATION    . carpel tunnel Bilateral    x2  . EYE SURGERY     bilateral cataracts  . KNEE ARTHROSCOPY Bilateral    numerous times  . REVERSE SHOULDER ARTHROPLASTY Right 01/18/2017  . REVERSE SHOULDER ARTHROPLASTY Right 01/18/2017   Procedure: REVERSE SHOULDER ARTHROPLASTY;  Surgeon: Tania Ade, MD;  Location: [place];  Service: Orthopedics;  Laterality: Right;  Right reverse total shoulder arthroplasty  . TONSILLECTOMY    . TUBAL LIGATION      MEDICATIONS: . acetaminophen (TYLENOL) 500 MG tablet  . Aflibercept (EYLEA IO)  . ALPRAZolam (XANAX) 0.25 MG tablet  . aspirin EC 81 MG tablet  . caffeine (STAY AWAKE MAXIMUM STRENGTH) 200 MG TABS tablet  . CALCIUM-VITAMIN D PO  . CRANBERRY PO  . desvenlafaxine (PRISTIQ) 50 MG 24 hr tablet  . HYDROcodone-acetaminophen (NORCO/VICODIN) 5-325 MG tablet  . meloxicam (MOBIC) 15 MG tablet  . methylphenidate (RITALIN) 10 MG tablet  . methylphenidate (RITALIN) 20 MG tablet  . Multiple Vitamin (MULTIVITAMIN WITH MINERALS) TABS tablet  . Multiple Vitamins-Minerals (PRESERVISION AREDS 2+MULTI VIT) CAPS  . omeprazole (PRILOSEC) 20 MG capsule  . Probiotic Product (ALIGN) 4 MG CAPS   No current facility-administered medications for this encounter.    Maia Plan [facility]Surgical Testing [phone] 08/13/18  2:18 PM

## 2018-08-13 NOTE — Anesthesia Preprocedure Evaluation (Addendum)
Anesthesia Evaluation  Patient identified by MRN, date of birth, ID band Patient awake    Reviewed: Allergy & Precautions, NPO status , Patient's Chart, lab work & pertinent test results  Airway Mallampati: II  TM Distance: >3 FB Neck ROM: Full    Dental no notable dental hx.    Pulmonary neg pulmonary ROS,    Pulmonary exam normal breath sounds clear to auscultation       Cardiovascular negative cardio ROS Normal cardiovascular exam Rhythm:Regular Rate:Normal     Neuro/Psych negative neurological ROS  negative psych ROS   GI/Hepatic Neg liver ROS, GERD  ,  Endo/Other  negative endocrine ROS  Renal/GU negative Renal ROS  negative genitourinary   Musculoskeletal negative musculoskeletal ROS (+)   Abdominal   Peds negative pediatric ROS (+)  Hematology negative hematology ROS (+)   Anesthesia Other Findings   Reproductive/Obstetrics negative OB ROS                             Anesthesia Physical Anesthesia Plan  ASA: II  Anesthesia Plan: General   Post-op Pain Management:  Regional for Post-op pain   Induction: Intravenous  PONV Risk Score and Plan: 3 and Ondansetron, Dexamethasone and Treatment may vary due to age or medical condition  Airway Management Planned: Oral ETT  Additional Equipment:   Intra-op Plan:   Post-operative Plan: Extubation in OR  Informed Consent: I have reviewed the patients History and Physical, chart, labs and discussed the procedure including the risks, benefits and alternatives for the proposed anesthesia with the patient or authorized representative who has indicated his/her understanding and acceptance.     Dental advisory given  Plan Discussed with: CRNA and Surgeon  Anesthesia Plan Comments: (See PAT note 08/09/2018, Konrad Felix, PA-C)       Anesthesia Quick Evaluation

## 2018-08-15 ENCOUNTER — Encounter (HOSPITAL_COMMUNITY)
Admission: RE | Disposition: A | Discharge status: Home / Self Care | Payer: Self-pay | Source: Home / Self Care | Attending: Orthopedic Surgery

## 2018-08-15 ENCOUNTER — Inpatient Hospital Stay (HOSPITAL_COMMUNITY): Payer: Medicare Other | Admitting: Physician Assistant

## 2018-08-15 ENCOUNTER — Other Ambulatory Visit: Payer: Self-pay

## 2018-08-15 ENCOUNTER — Encounter (HOSPITAL_COMMUNITY): Payer: Self-pay | Admitting: *Deleted

## 2018-08-15 ENCOUNTER — Inpatient Hospital Stay (HOSPITAL_COMMUNITY): Payer: Medicare Other | Admitting: Certified Registered"

## 2018-08-15 ENCOUNTER — Inpatient Hospital Stay (HOSPITAL_COMMUNITY): Payer: Medicare Other

## 2018-08-15 ENCOUNTER — Inpatient Hospital Stay (HOSPITAL_COMMUNITY)
Admission: RE | Admit: 2018-08-15 | Discharge: 2018-08-16 | DRG: 483 | Disposition: A | Discharge status: Home / Self Care | Payer: Medicare Other | Attending: Orthopedic Surgery | Admitting: Orthopedic Surgery

## 2018-08-15 DIAGNOSIS — F419 Anxiety disorder, unspecified: Secondary | ICD-10-CM | POA: Diagnosis present

## 2018-08-15 DIAGNOSIS — M19012 Primary osteoarthritis, left shoulder: Secondary | ICD-10-CM | POA: Diagnosis present

## 2018-08-15 DIAGNOSIS — Z7982 Long term (current) use of aspirin: Secondary | ICD-10-CM

## 2018-08-15 DIAGNOSIS — Z79899 Other long term (current) drug therapy: Secondary | ICD-10-CM

## 2018-08-15 DIAGNOSIS — M25712 Osteophyte, left shoulder: Secondary | ICD-10-CM | POA: Diagnosis present

## 2018-08-15 DIAGNOSIS — Z96611 Presence of right artificial shoulder joint: Secondary | ICD-10-CM | POA: Diagnosis present

## 2018-08-15 DIAGNOSIS — Z91041 Radiographic dye allergy status: Secondary | ICD-10-CM | POA: Diagnosis not present

## 2018-08-15 DIAGNOSIS — M75102 Unspecified rotator cuff tear or rupture of left shoulder, not specified as traumatic: Principal | ICD-10-CM | POA: Diagnosis present

## 2018-08-15 DIAGNOSIS — F329 Major depressive disorder, single episode, unspecified: Secondary | ICD-10-CM | POA: Diagnosis present

## 2018-08-15 DIAGNOSIS — H4420 Degenerative myopia, unspecified eye: Secondary | ICD-10-CM | POA: Diagnosis present

## 2018-08-15 DIAGNOSIS — H35349 Macular cyst, hole, or pseudohole, unspecified eye: Secondary | ICD-10-CM | POA: Diagnosis present

## 2018-08-15 DIAGNOSIS — K219 Gastro-esophageal reflux disease without esophagitis: Secondary | ICD-10-CM | POA: Diagnosis present

## 2018-08-15 DIAGNOSIS — Z888 Allergy status to other drugs, medicaments and biological substances status: Secondary | ICD-10-CM

## 2018-08-15 DIAGNOSIS — Z96612 Presence of left artificial shoulder joint: Secondary | ICD-10-CM

## 2018-08-15 DIAGNOSIS — Z85828 Personal history of other malignant neoplasm of skin: Secondary | ICD-10-CM | POA: Diagnosis not present

## 2018-08-15 HISTORY — PX: TOTAL SHOULDER ARTHROPLASTY: SHX126

## 2018-08-15 SURGERY — ARTHROPLASTY, SHOULDER, TOTAL
Anesthesia: General | Site: Shoulder | Laterality: Left

## 2018-08-15 MED ORDER — MIDAZOLAM HCL 2 MG/2ML IJ SOLN
INTRAMUSCULAR | Status: DC | PRN
  Administered 2018-08-15: 1 mg via INTRAVENOUS

## 2018-08-15 MED ORDER — ONDANSETRON HCL 4 MG/2ML IJ SOLN
INTRAMUSCULAR | Status: AC
  Filled 2018-08-15: qty 2

## 2018-08-15 MED ORDER — SODIUM CHLORIDE 0.9 % IV SOLN
INTRAVENOUS | Status: AC
  Administered 2018-08-16: 02:00:00 via INTRAVENOUS

## 2018-08-15 MED ORDER — VENLAFAXINE HCL ER 75 MG PO CP24
75.0000 mg | ORAL_CAPSULE | Freq: Every day | ORAL | Status: DC
  Filled 2018-08-15: qty 1

## 2018-08-15 MED ORDER — FLEET ENEMA 7-19 GM/118ML RE ENEM: 1.0000 | ENEMA | Freq: Once | RECTAL | Status: DC | PRN

## 2018-08-15 MED ORDER — BUPIVACAINE-EPINEPHRINE (PF) 0.25% -1:200000 IJ SOLN
INTRAMUSCULAR | Status: DC | PRN
  Administered 2018-08-15: 15 mL via PERINEURAL

## 2018-08-15 MED ORDER — EPHEDRINE 5 MG/ML INJ
INTRAVENOUS | Status: AC
  Filled 2018-08-15: qty 10

## 2018-08-15 MED ORDER — LACTATED RINGERS IV SOLN
INTRAVENOUS | Status: DC
  Administered 2018-08-15: 06:00:00 via INTRAVENOUS

## 2018-08-15 MED ORDER — TRANEXAMIC ACID-NACL 1000-0.7 MG/100ML-% IV SOLN
1000.0000 mg | INTRAVENOUS | Status: AC
  Administered 2018-08-15: 1000 mg via INTRAVENOUS
  Filled 2018-08-15: qty 100

## 2018-08-15 MED ORDER — HYDROCODONE-ACETAMINOPHEN 7.5-325 MG PO TABS
1.0000 | ORAL_TABLET | ORAL | Status: DC | PRN
  Administered 2018-08-15: 1 via ORAL
  Administered 2018-08-15 – 2018-08-16 (×3): 2 via ORAL
  Filled 2018-08-15: qty 1
  Filled 2018-08-15 (×3): qty 2

## 2018-08-15 MED ORDER — METOCLOPRAMIDE HCL 5 MG/ML IJ SOLN: 5.0000 mg | Freq: Three times a day (TID) | INTRAMUSCULAR | Status: DC | PRN

## 2018-08-15 MED ORDER — PROMETHAZINE HCL 25 MG/ML IJ SOLN: 6.2500 mg | INTRAMUSCULAR | Status: DC | PRN

## 2018-08-15 MED ORDER — CEFAZOLIN SODIUM-DEXTROSE 1-4 GM/50ML-% IV SOLN
1.0000 g | Freq: Four times a day (QID) | INTRAVENOUS | Status: AC
  Administered 2018-08-15 – 2018-08-16 (×3): 1 g via INTRAVENOUS
  Filled 2018-08-15 (×3): qty 50

## 2018-08-15 MED ORDER — ONDANSETRON HCL 4 MG/2ML IJ SOLN: 4.0000 mg | Freq: Four times a day (QID) | INTRAMUSCULAR | Status: DC | PRN

## 2018-08-15 MED ORDER — DEXAMETHASONE SODIUM PHOSPHATE 10 MG/ML IJ SOLN
INTRAMUSCULAR | Status: DC | PRN
  Administered 2018-08-15: 8 mg via INTRAVENOUS

## 2018-08-15 MED ORDER — METOCLOPRAMIDE HCL 5 MG PO TABS: 5.0000 mg | ORAL_TABLET | Freq: Three times a day (TID) | ORAL | Status: DC | PRN

## 2018-08-15 MED ORDER — ROCURONIUM BROMIDE 10 MG/ML (PF) SYRINGE
PREFILLED_SYRINGE | INTRAVENOUS | Status: DC | PRN
  Administered 2018-08-15: 50 mg via INTRAVENOUS
  Administered 2018-08-15: 5 mg via INTRAVENOUS

## 2018-08-15 MED ORDER — ONDANSETRON HCL 4 MG PO TABS: 4.0000 mg | ORAL_TABLET | Freq: Four times a day (QID) | ORAL | Status: DC | PRN

## 2018-08-15 MED ORDER — FENTANYL CITRATE (PF) 100 MCG/2ML IJ SOLN
INTRAMUSCULAR | Status: AC
  Filled 2018-08-15: qty 2

## 2018-08-15 MED ORDER — BUPIVACAINE LIPOSOME 1.3 % IJ SUSP
INTRAMUSCULAR | Status: DC | PRN
  Administered 2018-08-15: 10 mL via PERINEURAL

## 2018-08-15 MED ORDER — METHYLPHENIDATE HCL 10 MG PO TABS
10.0000 mg | ORAL_TABLET | Freq: Every day | ORAL | Status: DC
  Administered 2018-08-15: 10 mg via ORAL
  Filled 2018-08-15: qty 1

## 2018-08-15 MED ORDER — ACETAMINOPHEN 325 MG PO TABS: 325.0000 mg | ORAL_TABLET | Freq: Four times a day (QID) | ORAL | Status: DC | PRN

## 2018-08-15 MED ORDER — SODIUM CHLORIDE 0.9 % IV SOLN
INTRAVENOUS | Status: DC | PRN
  Administered 2018-08-15: 20 ug/min via INTRAVENOUS

## 2018-08-15 MED ORDER — BISACODYL 5 MG PO TBEC: 5.0000 mg | DELAYED_RELEASE_TABLET | Freq: Every day | ORAL | Status: DC | PRN

## 2018-08-15 MED ORDER — MORPHINE SULFATE (PF) 2 MG/ML IV SOLN: 0.5000 mg | INTRAVENOUS | Status: DC | PRN

## 2018-08-15 MED ORDER — STERILE WATER FOR IRRIGATION IR SOLN
Status: DC | PRN
  Administered 2018-08-15 (×2): 1000 mL

## 2018-08-15 MED ORDER — POLYETHYLENE GLYCOL 3350 17 G PO PACK: 17.0000 g | PACK | Freq: Every day | ORAL | Status: DC | PRN

## 2018-08-15 MED ORDER — PHENYLEPHRINE HCL (PRESSORS) 10 MG/ML IV SOLN
INTRAVENOUS | Status: AC
  Filled 2018-08-15: qty 1

## 2018-08-15 MED ORDER — SODIUM CHLORIDE 0.9 % IV SOLN
INTRAVENOUS | Status: AC | PRN
  Administered 2018-08-15: 1000 mL via INTRAMUSCULAR

## 2018-08-15 MED ORDER — SODIUM CHLORIDE 0.9 % IR SOLN
Status: DC | PRN
  Administered 2018-08-15: 1000 mL

## 2018-08-15 MED ORDER — HYDROCODONE-ACETAMINOPHEN 5-325 MG PO TABS: 1.0000 | ORAL_TABLET | ORAL | Status: DC | PRN

## 2018-08-15 MED ORDER — FENTANYL CITRATE (PF) 100 MCG/2ML IJ SOLN: 25.0000 ug | INTRAMUSCULAR | Status: DC | PRN

## 2018-08-15 MED ORDER — ONDANSETRON HCL 4 MG/2ML IJ SOLN
INTRAMUSCULAR | Status: DC | PRN
  Administered 2018-08-15: 4 mg via INTRAVENOUS

## 2018-08-15 MED ORDER — PHENOL 1.4 % MT LIQD
1.0000 | OROMUCOSAL | Status: DC | PRN
  Filled 2018-08-15: qty 177

## 2018-08-15 MED ORDER — PROPOFOL 10 MG/ML IV BOLUS
INTRAVENOUS | Status: AC
  Filled 2018-08-15: qty 40

## 2018-08-15 MED ORDER — DIPHENHYDRAMINE HCL 12.5 MG/5ML PO ELIX: 12.5000 mg | ORAL_SOLUTION | ORAL | Status: DC | PRN

## 2018-08-15 MED ORDER — PHENYLEPHRINE 40 MCG/ML (10ML) SYRINGE FOR IV PUSH (FOR BLOOD PRESSURE SUPPORT)
PREFILLED_SYRINGE | INTRAVENOUS | Status: AC
  Filled 2018-08-15: qty 10

## 2018-08-15 MED ORDER — PANTOPRAZOLE SODIUM 40 MG PO TBEC: 40.0000 mg | DELAYED_RELEASE_TABLET | Freq: Every day | ORAL | Status: DC

## 2018-08-15 MED ORDER — DOCUSATE SODIUM 100 MG PO CAPS
100.0000 mg | ORAL_CAPSULE | Freq: Two times a day (BID) | ORAL | Status: DC
  Administered 2018-08-15: 100 mg via ORAL
  Filled 2018-08-15: qty 1

## 2018-08-15 MED ORDER — PROPOFOL 10 MG/ML IV BOLUS
INTRAVENOUS | Status: DC | PRN
  Administered 2018-08-15: 120 mg via INTRAVENOUS

## 2018-08-15 MED ORDER — FENTANYL CITRATE (PF) 250 MCG/5ML IJ SOLN
INTRAMUSCULAR | Status: DC | PRN
  Administered 2018-08-15 (×2): 25 ug via INTRAVENOUS

## 2018-08-15 MED ORDER — MENTHOL 3 MG MT LOZG: 1.0000 | LOZENGE | OROMUCOSAL | Status: DC | PRN

## 2018-08-15 MED ORDER — ASPIRIN EC 81 MG PO TBEC
81.0000 mg | DELAYED_RELEASE_TABLET | Freq: Two times a day (BID) | ORAL | Status: DC
  Administered 2018-08-15 – 2018-08-16 (×2): 81 mg via ORAL
  Filled 2018-08-15 (×2): qty 1

## 2018-08-15 MED ORDER — CHLORHEXIDINE GLUCONATE 4 % EX LIQD: 60.0000 mL | Freq: Once | CUTANEOUS | Status: DC

## 2018-08-15 MED ORDER — ZOLPIDEM TARTRATE 5 MG PO TABS: 5.0000 mg | ORAL_TABLET | Freq: Every evening | ORAL | Status: DC | PRN

## 2018-08-15 MED ORDER — LIDOCAINE 2% (20 MG/ML) 5 ML SYRINGE
INTRAMUSCULAR | Status: DC | PRN
  Administered 2018-08-15: 60 mg via INTRAVENOUS

## 2018-08-15 MED ORDER — SUGAMMADEX SODIUM 200 MG/2ML IV SOLN
INTRAVENOUS | Status: DC | PRN
  Administered 2018-08-15: 120 mg via INTRAVENOUS

## 2018-08-15 MED ORDER — CEFAZOLIN SODIUM-DEXTROSE 2-4 GM/100ML-% IV SOLN
2.0000 g | INTRAVENOUS | Status: AC
  Administered 2018-08-15: 2 g via INTRAVENOUS
  Filled 2018-08-15: qty 100

## 2018-08-15 MED ORDER — DEXAMETHASONE SODIUM PHOSPHATE 10 MG/ML IJ SOLN
INTRAMUSCULAR | Status: AC
  Filled 2018-08-15: qty 1

## 2018-08-15 MED ORDER — PHENYLEPHRINE 40 MCG/ML (10ML) SYRINGE FOR IV PUSH (FOR BLOOD PRESSURE SUPPORT)
PREFILLED_SYRINGE | INTRAVENOUS | Status: DC | PRN
  Administered 2018-08-15: 40 ug via INTRAVENOUS
  Administered 2018-08-15: 20 ug via INTRAVENOUS
  Administered 2018-08-15 (×2): 40 ug via INTRAVENOUS

## 2018-08-15 MED ORDER — ROCURONIUM BROMIDE 10 MG/ML (PF) SYRINGE
PREFILLED_SYRINGE | INTRAVENOUS | Status: AC
  Filled 2018-08-15: qty 10

## 2018-08-15 MED ORDER — MIDAZOLAM HCL 2 MG/2ML IJ SOLN
INTRAMUSCULAR | Status: AC
  Filled 2018-08-15: qty 2

## 2018-08-15 MED ORDER — EPHEDRINE SULFATE-NACL 50-0.9 MG/10ML-% IV SOSY
PREFILLED_SYRINGE | INTRAVENOUS | Status: DC | PRN
  Administered 2018-08-15: 10 mg via INTRAVENOUS
  Administered 2018-08-15 (×7): 5 mg via INTRAVENOUS
  Administered 2018-08-15: 10 mg via INTRAVENOUS
  Administered 2018-08-15 (×2): 5 mg via INTRAVENOUS
  Administered 2018-08-15: 10 mg via INTRAVENOUS
  Administered 2018-08-15: 5 mg via INTRAVENOUS

## 2018-08-15 MED ORDER — ALUM & MAG HYDROXIDE-SIMETH 200-200-20 MG/5ML PO SUSP: 30.0000 mL | ORAL | Status: DC | PRN

## 2018-08-15 SURGICAL SUPPLY — 76 items
AID PSTN UNV HD RSTRNT DISP (MISCELLANEOUS) ×1
APL PRP STRL LF DISP 70% ISPRP (MISCELLANEOUS) ×1
BAG SPEC THK2 15X12 ZIP CLS (MISCELLANEOUS) ×1
BAG ZIPLOCK 12X15 (MISCELLANEOUS) ×2 IMPLANT
BASEPLATE P2 COATD GLND 6.5X30 (Shoulder) IMPLANT
BIT DRILL 1.6MX128 (BIT) ×2 IMPLANT
BIT DRILL 2.4X128 (BIT) IMPLANT
BIT DRILL 2.5 DIA 127 CALI (BIT) ×1 IMPLANT
BIT DRILL 4 DIA CALIBRATED (BIT) ×1 IMPLANT
BLADE SAW SAG 73X25 THK (BLADE) ×1
BLADE SAW SGTL 18X1.27X75 (BLADE) IMPLANT
BLADE SAW SGTL 73X25 THK (BLADE) ×1 IMPLANT
BSPLAT GLND 30 STRL LF SHLDR (Shoulder) ×1 IMPLANT
CHLORAPREP W/TINT 26 (MISCELLANEOUS) ×2 IMPLANT
CLSR STERI-STRIP ANTIMIC 1/2X4 (GAUZE/BANDAGES/DRESSINGS) ×1 IMPLANT
COOLER ICEMAN CLASSIC (MISCELLANEOUS) IMPLANT
COVER BACK TABLE 60X90IN (DRAPES) ×2 IMPLANT
COVER SURGICAL LIGHT HANDLE (MISCELLANEOUS) ×2 IMPLANT
COVER WAND RF STERILE (DRAPES) IMPLANT
DRAPE INCISE IOBAN 66X45 STRL (DRAPES) ×2 IMPLANT
DRAPE ORTHO SPLIT 77X108 STRL (DRAPES) ×4
DRAPE POUCH INSTRU U-SHP 10X18 (DRAPES) ×2 IMPLANT
DRAPE SURG 17X11 SM STRL (DRAPES) ×2 IMPLANT
DRAPE SURG ORHT 6 SPLT 77X108 (DRAPES) ×2 IMPLANT
DRAPE U-SHAPE 47X51 STRL (DRAPES) ×2 IMPLANT
DRSG AQUACEL AG ADV 3.5X 6 (GAUZE/BANDAGES/DRESSINGS) ×2 IMPLANT
ELECT BLADE TIP CTD 4 INCH (ELECTRODE) ×2 IMPLANT
ELECT REM PT RETURN 15FT ADLT (MISCELLANEOUS) ×2 IMPLANT
GLOVE BIO SURGEON STRL SZ7 (GLOVE) ×2 IMPLANT
GLOVE BIO SURGEON STRL SZ7.5 (GLOVE) ×2 IMPLANT
GLOVE BIOGEL PI IND STRL 7.0 (GLOVE) ×1 IMPLANT
GLOVE BIOGEL PI IND STRL 8 (GLOVE) ×1 IMPLANT
GLOVE BIOGEL PI INDICATOR 7.0 (GLOVE) ×1
GLOVE BIOGEL PI INDICATOR 8 (GLOVE) ×1
GOWN STRL REUS W/TWL LRG LVL3 (GOWN DISPOSABLE) ×2 IMPLANT
GOWN STRL REUS W/TWL XL LVL3 (GOWN DISPOSABLE) ×2 IMPLANT
HANDPIECE INTERPULSE COAX TIP (DISPOSABLE) ×2
HOOD PEEL AWAY FLYTE STAYCOOL (MISCELLANEOUS) ×6 IMPLANT
INSERT SMALL SOCKET 32MM NEU (Insert) ×1 IMPLANT
KIT BASIN OR (CUSTOM PROCEDURE TRAY) ×2 IMPLANT
KIT TURNOVER KIT A (KITS) IMPLANT
MANIFOLD NEPTUNE II (INSTRUMENTS) ×2 IMPLANT
NDL MA TROC 1/2 (NEEDLE) ×1 IMPLANT
NEEDLE MA TROC 1/2 (NEEDLE) ×2 IMPLANT
NS IRRIG 1000ML POUR BTL (IV SOLUTION) ×2 IMPLANT
P2 COATDE GLNOID BSEPLT 6.5X30 (Shoulder) ×2 IMPLANT
PACK SHOULDER (CUSTOM PROCEDURE TRAY) ×2 IMPLANT
PAD COLD SHLDR WRAP-ON (PAD) ×1 IMPLANT
PROTECTOR NERVE ULNAR (MISCELLANEOUS) ×2 IMPLANT
RESTRAINT HEAD UNIVERSAL NS (MISCELLANEOUS) ×2 IMPLANT
RETRIEVER SUT HEWSON (MISCELLANEOUS) ×1 IMPLANT
SCREW BONE LOCKING RSP 5.0X14 (Screw) ×4 IMPLANT
SCREW BONE LOCKING RSP 5.0X30 (Screw) ×4 IMPLANT
SCREW BONE RSP LOCK 5X14 (Screw) IMPLANT
SCREW BONE RSP LOCK 5X30 (Screw) IMPLANT
SCREW RETAIN W/HEAD 4MM OFFSET (Shoulder) ×1 IMPLANT
SET HNDPC FAN SPRY TIP SCT (DISPOSABLE) ×1 IMPLANT
SLING ARM FOAM STRAP MED (SOFTGOODS) ×1 IMPLANT
SLING ARM IMMOBILIZER LRG (SOFTGOODS) ×1 IMPLANT
SLING ARM IMMOBILIZER MED (SOFTGOODS) ×1 IMPLANT
SMARTMIX MINI TOWER (MISCELLANEOUS)
SPONGE LAP 18X18 RF (DISPOSABLE) ×2 IMPLANT
STEM HUMERAL 8X48 SHOULDER (Miscellaneous) ×1 IMPLANT
STRIP CLOSURE SKIN 1/2X4 (GAUZE/BANDAGES/DRESSINGS) ×2 IMPLANT
SUCTION FRAZIER HANDLE 12FR (TUBING) ×1
SUCTION TUBE FRAZIER 12FR DISP (TUBING) ×1 IMPLANT
SUPPORT WRAP ARM LG (MISCELLANEOUS) ×2 IMPLANT
SUT ETHIBOND 2 V 37 (SUTURE) IMPLANT
SUT ETHIBOND NAB CT1 #1 30IN (SUTURE) ×2 IMPLANT
SUT MNCRL AB 4-0 PS2 18 (SUTURE) ×2 IMPLANT
SUT VIC AB 2-0 CT1 27 (SUTURE) ×2
SUT VIC AB 2-0 CT1 TAPERPNT 27 (SUTURE) ×1 IMPLANT
TOWEL OR 17X26 10 PK STRL BLUE (TOWEL DISPOSABLE) ×2 IMPLANT
TOWEL OR NON WOVEN STRL DISP B (DISPOSABLE) ×2 IMPLANT
TOWER SMARTMIX MINI (MISCELLANEOUS) IMPLANT
WATER STERILE IRR 1000ML POUR (IV SOLUTION) ×3 IMPLANT

## 2018-08-15 NOTE — Transfer of Care (Signed)
Immediate Anesthesia Transfer of Care Note  Patient: Candice Nolan  Procedure(s) Performed: REVERSE TOTAL SHOULDER ARTHROPLASTY (Left Shoulder)  Patient Location: PACU  Anesthesia Type:General  Level of Consciousness: awake, alert  and oriented  Airway & Oxygen Therapy: Patient Spontanous Breathing and Patient connected to face mask oxygen  Post-op Assessment: Report given to RN and Post -op Vital signs reviewed and stable  Post vital signs: Reviewed and stable  Last Vitals:  Vitals Value Taken Time  BP 142/63 08/15/18 0922  Temp 36.5 C 08/15/18 0922  Pulse 95 08/15/18 0922  Resp 21 08/15/18 0922  SpO2 100 % 08/15/18 0922  Vitals shown include unvalidated device data.  Last Pain:  Vitals:   08/15/18 0551  TempSrc: Oral         Complications: No apparent anesthesia complications

## 2018-08-15 NOTE — Anesthesia Postprocedure Evaluation (Signed)
Anesthesia Post Note  Patient: Candice Nolan  Procedure(s) Performed: REVERSE TOTAL SHOULDER ARTHROPLASTY (Left Shoulder)     Patient location during evaluation: PACU Anesthesia Type: General Level of consciousness: awake and alert Pain management: pain level controlled Vital Signs Assessment: post-procedure vital signs reviewed and stable Respiratory status: spontaneous breathing, nonlabored ventilation, respiratory function stable and patient connected to nasal cannula oxygen Cardiovascular status: blood pressure returned to baseline and stable Postop Assessment: no apparent nausea or vomiting Anesthetic complications: no    Last Vitals:  Vitals:   08/15/18 1356 08/15/18 1744  BP: (!) 125/48 (!) 130/52  Pulse: 90 93  Resp: 15 16  Temp:  36.7 C  SpO2: 100% 93%    Last Pain:  Vitals:   08/15/18 1744  TempSrc: Oral  PainSc:                  Tanya Marvin S

## 2018-08-15 NOTE — Op Note (Signed)
Procedure(s): REVERSE TOTAL SHOULDER ARTHROPLASTY Procedure Note  Candice Nolan female 78 y.o. 08/15/2018  Procedure(s) and Anesthesia Type:    LEFT REVERSE TOTAL SHOULDER ARTHROPLASTY - Choice   Indications:  78 y.o. 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 Pain and dysfunction interfered with quality of life and nonoperative treatment with activity modification, NSAIDS and injections failed.     Surgeon: [name]   Assistants: Jeanmarie Hubert PA-C [facility] was present and scrubbed throughout the procedure and was essential in positioning, retraction, exposure, and closure)  Anesthesia: General endotracheal anesthesia with preoperative interscalene block given by the attending anesthesiologist   Procedure Detail  REVERSE TOTAL SHOULDER ARTHROPLASTY   Estimated Blood Loss:  200 mL         Drains: none  Blood Given: none          Specimens: none        Complications:  * No complications entered in OR log *         Disposition: PACU - hemodynamically stable.         Condition: stable      OPERATIVE FINDINGS:  A DJO Altivate pressfit reverse total shoulder arthroplasty was placed with a  size 8 short stem, a 32-4 glenosphere, and a standard poly insert. The base plate  fixation was excellent.  PROCEDURE: The patient was identified in the preoperative holding area  where I personally marked the operative site after verifying site, side,  and procedure with the patient. An interscalene block given by  the attending anesthesiologist in the holding area and the patient was taken back to the operating room where all extremities were  carefully padded in position after general anesthesia was induced. She  was placed in a beach-chair position and the operative upper extremity was  prepped and draped in a standard sterile fashion. An approximately 10-  cm incision was made from the tip of the coracoid process to the  center  point of the humerus at the level of the axilla. Dissection was carried  down through subcutaneous tissues to the level of the cephalic vein  which was taken laterally with the deltoid. The pectoralis major was  retracted medially. The subdeltoid space was developed and the lateral  edge of the conjoined tendon was identified. The undersurface of  conjoined tendon was palpated and the musculocutaneous nerve was not in  the field. Retractor was placed underneath the conjoined and second  retractor was placed lateral into the deltoid. The circumflex humeral  artery and vessels were identified and clamped and coagulated. The  biceps tendon was severely degenerated and tenotomized.  The proximal 5 cm was cut and discarded..  The subscapularis was chronically torn.  The  joint was then gently externally rotated while the capsule was released  from the humeral neck around to just beyond the 6 o'clock position. At  this point, the joint was dislocated and the humeral head was presented  into the wound. The excessive osteophyte formation was removed with a  large rongeur.  The cutting guide was used to make the appropriate  head cut and the head was saved for potentially bone grafting.  The glenoid was exposed with the arm in an  abducted extended position. The anterior and posterior labrum were  completely excised and the capsule was released circumferentially to  allow for exposure of the glenoid for preparation. The 2.5 mm drill was  placed using the guide in 5-10 inferior angulation  and the tap was then advanced in the same hole. Small and large reamers were then used. The tap was then removed and the Metaglene was then screwed in with excellent purchase.  The peripheral guide was then used to drilled measured and filled peripheral locking screws. The size 32-4 glenosphere was then impacted on the [facility] taper and the central screw was placed. The humerus was then again exposed and the  diaphyseal reamers were used followed by the metaphyseal reamers. The final broach was left in place in the proximal trial was placed. The joint was reduced and with this implant it was felt that soft tissue tensioning was appropriate with excellent stability and excellent range of motion. Therefore, final humeral stem was placed press-fit with bone grafting from the humeral head.  And then the trial polyethylene inserts were tested again and the above implant was felt to be the most appropriate for final insertion. The joint was reduced taken through full range of motion and felt to be stable. Soft tissue tension was appropriate.  The joint was then copiously irrigated with pulse  lavage and the wound was then closed. The subscapularis was not repaired.  Skin was closed with 2-0 Vicryl in a deep dermal layer and 4-0  Monocryl for skin closure. Steri-Strips were applied. Sterile  dressings were then applied as well as a sling. The patient was allowed  to awaken from general anesthesia, transferred to stretcher, and taken  to recovery room in stable condition.   POSTOPERATIVE PLAN: The patient will be kept in the hospital postoperatively  for pain control and therapy.

## 2018-08-15 NOTE — H&P (Signed)
Candice Nolan is an 78 y.o. female.   Chief Complaint: L shoulder pain and dsyfunction HPI: Endstage L shoulder arthritis with with irrepairable rotator cuff tear with significant pain and dysfunction, failed conservative measures.  Pain interferes with sleep and quality of life.  Past Medical History:  Diagnosis Date  . Anxiety   . Arthritis   . 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 Cancer ([place])    basal cell skin cancer removed  . Complication of anesthesia    elevated temp post-op '70, no problems since. Received Suprane and sccinylcholine 03/15/09 w/o issue.   . Depression   . GERD (gastroesophageal reflux disease)   . H/O bladder infections   . Incontinence    at young age , and bowel as a young adult . Had bladder reconstruction and part of colon was used as top of bladder . Pt. self caths 4 x a day  . Macular degeneration   . Pneumonia    as a child    Past Surgical History:  Procedure Laterality Date  . ABDOMINAL HYSTERECTOMY    . APPENDECTOMY    . BICEPS TENDON REPAIR    . BLADDER AUGMENTATION    . carpel tunnel Bilateral    x2  . EYE SURGERY     bilateral cataracts  . KNEE ARTHROSCOPY Bilateral    numerous times  . REVERSE SHOULDER ARTHROPLASTY Right 01/18/2017  . REVERSE SHOULDER ARTHROPLASTY Right 01/18/2017   Procedure: REVERSE SHOULDER ARTHROPLASTY;  Surgeon: Tania Ade, MD;  Location: [place];  Service: Orthopedics;  Laterality: Right;  Right reverse total shoulder arthroplasty  . TONSILLECTOMY    . TUBAL LIGATION      History reviewed. No pertinent family history. Social History:  reports that she has never smoked. She has never used smokeless tobacco. She reports current alcohol use. She reports that she does not use drugs.  Allergies:  Allergies  Allergen Reactions  . Ascorbate Swelling    Facial ASORBIC ACID - can not have in pill form - foods are ok  . Contrast Media [Iodinated Diagnostic Agents] Shortness Of  Breath  . Iodine Shortness Of Breath  . Chloramphenicol     UNSPECIFIED REACTION    . Pentazocine Anxiety    [place]   . Tyloxapol Anxiety    Medications Prior to Admission  Medication Sig Dispense Refill  . acetaminophen (TYLENOL) 500 MG tablet Take 500 mg by mouth every 6 (six) hours as needed (pain).    Marland Kitchen ALPRAZolam (XANAX) 0.25 MG tablet Take 0.125 mg by mouth See admin instructions. take 1/2 tab 30 minutes prior to eye injections every 8 weeks    . aspirin EC 81 MG tablet Take 81 mg by mouth daily.    . caffeine (STAY AWAKE MAXIMUM STRENGTH) 200 MG TABS tablet Take 100 mg by mouth every 4 (four) hours as needed (alertness).    . CALCIUM-VITAMIN D PO Take 1 tablet by mouth daily.    Marland Kitchen CRANBERRY PO Take 650 mg by mouth daily.     Marland Kitchen desvenlafaxine (PRISTIQ) 50 MG 24 hr tablet Take 75 mg by mouth every morning.    Marland Kitchen HYDROcodone-acetaminophen (NORCO/VICODIN) 5-325 MG tablet Take 0.5 tablets by mouth every 6 (six) hours as needed for moderate pain.    . meloxicam (MOBIC) 15 MG tablet Take 15 mg by mouth daily as needed for pain.    . methylphenidate (RITALIN) 10 MG tablet Take 10 mg by mouth every morning. [place]  mg total    . methylphenidate (RITALIN) 20 MG tablet Take 20 mg by mouth every morning. 30 mg total    . Multiple Vitamin (MULTIVITAMIN WITH MINERALS) TABS tablet Take 1 tablet by mouth daily.    . Multiple Vitamins-Minerals (PRESERVISION AREDS 2+MULTI VIT) CAPS Take 1 tablet by mouth 2 (two) times daily.    Marland Kitchen omeprazole (PRILOSEC) 20 MG capsule Take 20 mg by mouth daily.    . Probiotic Product (ALIGN) 4 MG CAPS Take 4 mg by mouth daily.    . Aflibercept (EYLEA IO) Inject into the eye See admin instructions. Every 8-10 weeks      No results found for this or any previous visit (from the past 48 hour(s)). No results found.  Review of Systems  All other systems reviewed and are negative.   Blood pressure (!) 143/67, pulse 75, temperature 98.6 F (37 C), temperature source Oral,  resp. rate 20, SpO2 100 %. Physical Exam  Constitutional: She is oriented to person, place, and time. She appears well-developed and well-nourished.  HENT:  Head: Atraumatic.  Eyes: EOM are normal.  Cardiovascular: Intact distal pulses.  Respiratory: Effort normal.  Musculoskeletal:     Comments: LUE shoulder pain with limited ROM. NVID  Neurological: She is alert and oriented to person, place, and time.  Skin: Skin is warm and dry.  Psychiatric: She has a normal mood and affect.     Assessment/Plan L shoulder rotator cuff tear arthropathy failed conservative treatment Plan L reverse TSA Risks / benefits of surgery discussed Consent on chart  NPO for OR Preop antibiotics   [name], MD 08/15/2018, 7:21 AM

## 2018-08-15 NOTE — Discharge Instructions (Signed)

## 2018-08-15 NOTE — Anesthesia Procedure Notes (Signed)
Anesthesia Procedure Image    

## 2018-08-15 NOTE — Anesthesia Procedure Notes (Signed)
Procedure Name: Intubation Date/Time: 08/15/2018 7:44 AM Performed by: Eben Burow, CRNA Pre-anesthesia Checklist: Patient identified, Emergency Drugs available, Suction available, Patient being monitored and Timeout performed Patient Re-evaluated:Patient Re-evaluated prior to induction Oxygen Delivery Method: Circle system utilized Preoxygenation: Pre-oxygenation with 100% oxygen Induction Type: IV induction Ventilation: Mask ventilation without difficulty Laryngoscope Size: Mac and 4 Grade View: Grade I Tube type: Oral Tube size: 7.0 mm Number of attempts: 1 Airway Equipment and Method: Stylet Placement Confirmation: ETT inserted through vocal cords under direct vision,  positive ETCO2 and breath sounds checked- equal and bilateral Secured at: 22 cm Tube secured with: Tape Dental Injury: Teeth and Oropharynx as per pre-operative assessment

## 2018-08-15 NOTE — Anesthesia Procedure Notes (Signed)
Anesthesia Regional Block: Interscalene brachial plexus block   Pre-Anesthetic Checklist: ,, timeout performed, Correct Patient, Correct Site, Correct Laterality, Correct Procedure, Correct Position, site marked, Risks and benefits discussed,  Surgical consent,  Pre-op evaluation,  At surgeon's request and post-op pain management  Laterality: Left  Prep: chloraprep       Needles:  Injection technique: Single-shot  Needle Type: Echogenic Needle     Needle Length: 9cm      Additional Needles:   Procedures:,,,, ultrasound used (permanent image in chart),,,,  Narrative:  Start time: 08/15/2018 7:05 AM End time: 08/15/2018 7:16 AM Injection made incrementally with aspirations every 5 mL.  Performed by: Personally  Anesthesiologist: Myrtie Soman, MD  Additional Notes: Patient tolerated the procedure well without complications

## 2018-08-16 ENCOUNTER — Encounter (HOSPITAL_COMMUNITY): Payer: Self-pay | Admitting: Orthopedic Surgery

## 2018-08-16 LAB — BASIC METABOLIC PANEL
Anion gap: 8 (ref 5–15)
BUN: 19 mg/dL (ref 8–23)
CO2: 24 mmol/L (ref 22–32)
Calcium: 8.2 mg/dL — ABNORMAL LOW (ref 8.9–10.3)
Chloride: 106 mmol/L (ref 98–111)
Creatinine, Ser: 0.63 mg/dL (ref 0.44–1.00)
GFR calc Af Amer: >60 mL/min (ref >60)
GFR calc non Af Amer: >60 mL/min (ref >60)
Glucose, Bld: 118 mg/dL — ABNORMAL HIGH (ref 70–99)
Potassium: 4 mmol/L (ref 3.5–5.1)
Sodium: 138 mmol/L (ref 135–145)

## 2018-08-16 LAB — CBC
HCT: 31.3 % — ABNORMAL LOW (ref 36.0–46.0)
Hemoglobin: 9.6 g/dL — ABNORMAL LOW (ref 12.0–15.0)
MCH: 30.1 pg (ref 26.0–34.0)
MCHC: 30.7 g/dL (ref 30.0–36.0)
MCV: 98.1 fL (ref 80.0–100.0)
Platelets: 198 10*3/uL (ref 150–400)
RBC: 3.19 MIL/uL — ABNORMAL LOW (ref 3.87–5.11)
RDW: 13.4 % (ref 11.5–15.5)
WBC: 10.8 10*3/uL — ABNORMAL HIGH (ref 4.0–10.5)
nRBC: 0 % (ref 0.0–0.2)

## 2018-08-16 MED ORDER — HYDROCODONE-ACETAMINOPHEN 5-325 MG PO TABS: 0.5000 | ORAL_TABLET | Freq: Four times a day (QID) | ORAL | 0 refills | Status: DC | PRN

## 2018-08-16 NOTE — Progress Notes (Signed)
Patient and husband have been given discharge instructions and packet.

## 2018-08-16 NOTE — Evaluation (Signed)
Occupational Therapy Evaluation Patient Details Name: Candice Nolan MRN: [id number] DOB: [birth year] Today's Date: 08/16/2018    History of Present Illness s/p L reverse TSA with history of same sx on R   Clinical Impression   All education completed with pt verbalizing and/or demonstrating understanding. Performed ROM L elbow to hand. Pt reports being able to self cath with her R (non dominant) hand last night. She will have her husband available to assist as needed.     Follow Up Recommendations  Follow surgeon's recommendation for DC plan and follow-up therapies    Equipment Recommendations  None recommended by OT    Recommendations for Other Services       Precautions / Restrictions Precautions Precautions: Shoulder Type of Shoulder Precautions: no shoulder movement, elbow to hand AROM only Shoulder Interventions: Shoulder sling/immobilizer Precaution Booklet Issued: Yes (comment) Restrictions Weight Bearing Restrictions: Yes LUE Weight Bearing: Non weight bearing      Mobility Bed Mobility                  Transfers Overall transfer level: Independent Equipment used: None                  Balance                                           ADL either performed or assessed with clinical judgement   ADL Overall ADL's : Modified independent                                       General ADL Comments: Educated in positioning L UE in bed and chair, sling use and donning and doffing, compensatory strategies for ADL and NWB precaution.     Vision Baseline Vision/History: Wears glasses Wears Glasses: At all times Patient Visual Report: No change from baseline       Perception     Praxis      Pertinent Vitals/Pain Pain Assessment: Faces Faces Pain Scale: Hurts a little bit Pain Location: L shoulder Pain Descriptors / Indicators: Sore Pain Intervention(s): Monitored during session;Premedicated before  session     Hand Dominance Left   Extremity/Trunk Assessment Upper Extremity Assessment Upper Extremity Assessment: LUE deficits/detail LUE Deficits / Details: performed ROM elbow to hand x 10 LUE Coordination: decreased gross motor   Lower Extremity Assessment Lower Extremity Assessment: Overall WFL for tasks assessed   Cervical / Trunk Assessment Cervical / Trunk Assessment: Normal   Communication Communication Communication: No difficulties   Cognition Arousal/Alertness: Awake/alert Behavior During Therapy: Impulsive;Restless Overall Cognitive Status: Within Functional Limits for tasks assessed                                 General Comments: pt reports hx of ADD   General Comments       Exercises     Shoulder Instructions      Home Living Family/patient expects to be discharged to:: Private residence Living Arrangements: Spouse/significant other Available Help at Discharge: Family;Available 24 hours/day Type of Home: Independent living facility       Home Layout: One level     Bathroom Shower/Tub: Occupational psychologist: Standard  Prior Functioning/Environment Level of Independence: Independent                 OT Problem List:        OT Treatment/Interventions:      OT Goals(Current goals can be found in the care plan section) Acute Rehab OT Goals Patient Stated Goal: regain use of L UE  OT Frequency:     Barriers to D/C:            Co-evaluation              AM-PAC OT "6 Clicks" Daily Activity     Outcome Measure Help from another person eating meals?: None Help from another person taking care of personal grooming?: None Help from another person toileting, which includes using toliet, bedpan, or urinal?: None Help from another person bathing (including washing, rinsing, drying)?: None Help from another person to put on and taking off regular upper body clothing?: None Help from  another person to put on and taking off regular lower body clothing?: None 6 Click Score: 24   End of Session    Activity Tolerance: Patient tolerated treatment well Patient left: in chair;with call bell/phone within reach  OT Visit Diagnosis: Pain                Time: 1749-4496 OT Time Calculation (min): 34 min Charges:  OT General Charges $OT Visit: 1 Visit OT Evaluation $OT Eval Low Complexity: 1 Low OT Treatments $Self Care/Home Management : 8-22 mins  Nestor Lewandowsky, OTR/L Acute Rehabilitation Services Pager: [phone] Office: [phone]  Candice Nolan 08/16/2018, 9:44 AM

## 2018-08-16 NOTE — Discharge Summary (Signed)
Patient ID: Candice Nolan MRN: [id number] DOB/AGE: October 17, 1940 78 y.o.  Admit date: 08/15/2018 Discharge date: 08/16/2018  Admission Diagnoses:  Active Problems:   S/P reverse total shoulder arthroplasty, left   Discharge Diagnoses:  Same  Past Medical History:  Diagnosis Date  . Anxiety   . Arthritis   . 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 Cancer ([place])    basal cell skin cancer removed  . Complication of anesthesia    elevated temp post-op '70, no problems since. Received Suprane and sccinylcholine 03/15/09 w/o issue.   . Depression   . GERD (gastroesophageal reflux disease)   . H/O bladder infections   . Incontinence    at young age , and bowel as a young adult . Had bladder reconstruction and part of colon was used as top of bladder . Pt. self caths 4 x a day  . Macular degeneration   . Pneumonia    as a child    Surgeries: Procedure(s): REVERSE TOTAL SHOULDER ARTHROPLASTY on 08/15/2018   Consultants:   Discharged Condition: Improved  Hospital Course: Candice Nolan is an 78 y.o. female who was admitted 08/15/2018 for operative treatment of left shoulder rot cuff tear arthropathy. Patient has severe unremitting pain that affects sleep, daily activities, and work/hobbies. After pre-op clearance the patient was taken to the operating room on 08/15/2018 and underwent  Procedure(s): REVERSE TOTAL SHOULDER ARTHROPLASTY.    Patient was given perioperative antibiotics:  Anti-infectives (From admission, onward)   Start     Dose/Rate Route Frequency Ordered Stop   08/15/18 1400  ceFAZolin (ANCEF) IVPB 1 g/50 mL premix     1 g 100 mL/hr over 30 Minutes Intravenous Every 6 hours 08/15/18 1100 08/16/18 0214   08/15/18 0600  ceFAZolin (ANCEF) IVPB 2g/100 mL premix     2 g 200 mL/hr over 30 Minutes Intravenous On call to O.R. 08/15/18 4098 08/15/18 0745       Patient was given sequential compression devices, early ambulation, and asa to  prevent DVT.  Patient benefited maximally from hospital stay and there were no complications.    Recent vital signs:  Patient Vitals for the past 24 hrs:  BP Temp Temp src Pulse Resp SpO2 Height Weight  08/16/18 0536 (!) 125/56 98 F (36.7 C) Oral 66 18 100 % - -  08/16/18 0133 110/77 97.9 F (36.6 C) Oral 81 16 95 % - -  08/15/18 2158 106/70 98.4 F (36.9 C) Oral 83 17 98 % - -  08/15/18 1920 - - - - - - 4\' 9"  (1.448 m) 67.6 kg  08/15/18 1744 (!) 130/52 98 F (36.7 C) Oral 93 16 93 % - -  08/15/18 1356 (!) 125/48 - - 90 15 100 % - -  08/15/18 1253 (!) 106/52 97.8 F (36.6 C) - 76 18 100 % - -  08/15/18 1150 (!) 111/53 - - 81 18 100 % - -  08/15/18 1052 (!) 124/58 97.6 F (36.4 C) Oral 94 - 96 % - -  08/15/18 1030 (!) 123/55 - - 82 19 100 % - -  08/15/18 1015 (!) 126/56 - - 85 19 100 % - -  08/15/18 1000 (!) 125/55 - - 89 (!) 23 100 % - -  08/15/18 0945 135/60 - - 96 (!) 24 99 % - -  08/15/18 0930 139/70 - - 97 20 100 % - -  08/15/18 0922 (!) 142/63 97.7 F (36.5 C) - 95  18 - - -     Recent laboratory studies:  Recent Labs    08/16/18 0248  WBC 10.8*  HGB 9.6*  HCT 31.3*  PLT 198  NA 138  K 4.0  CL 106  CO2 24  BUN 19  CREATININE 0.63  GLUCOSE 118*  CALCIUM 8.2*     Discharge Medications:     Diagnostic Studies: Dg Chest 2 View  Result Date: 08/10/2018 CLINICAL DATA:  Preoperative chest radiograph prior to shoulder surgery. EXAM: CHEST - 2 VIEW COMPARISON:  01/11/2017 radiographs FINDINGS: The cardiomediastinal silhouette is unremarkable. There is no evidence of focal airspace disease, pulmonary edema, suspicious pulmonary nodule/mass, pleural effusion, or pneumothorax. No acute bony abnormalities are identified. RIGHT shoulder arthroplasty and LEFT shoulder degenerative changes noted. IMPRESSION: No active cardiopulmonary disease. Electronically Signed   By: Margarette Canada M.D.   On: 08/10/2018 15:01   Dg Shoulder Left Port  Result Date: 08/15/2018 CLINICAL  DATA:  Status post reversed left total shoulder arthroplasty. EXAM: LEFT SHOULDER - 1 VIEW COMPARISON:  None. FINDINGS: The glenoid and humeral components are well situated. No fracture or dislocation is noted. Visualized ribs are unremarkable. IMPRESSION: Status post left total shoulder arthroplasty. Electronically Signed   By: Marijo Conception M.D.   On: 08/15/2018 09:56    Disposition: Discharge disposition: 01-Home or Self Care       Discharge Instructions    Call MD / Call 911   Complete by: As directed    If you experience chest pain or shortness of breath, CALL 911 and be transported to the hospital emergency room.  If you develope a fever above 101 F, pus (white drainage) or increased drainage or redness at the wound, or calf pain, call your surgeon's office.   Constipation Prevention   Complete by: As directed    Drink plenty of fluids.  Prune juice may be helpful.  You may use a stool softener, such as Colace (over the counter) 100 mg twice a day.  Use MiraLax (over the counter) for constipation as needed.   Diet - low sodium heart healthy   Complete by: As directed    Increase activity slowly as tolerated   Complete by: As directed       Follow-up Information    Tania Ade, MD. Schedule an appointment as soon as possible for a visit in 2 weeks.   Specialty: Orthopedic Surgery Contact information: [address] [place] [address] [phone]            Signed: Grier Mitts 08/16/2018, 8:12 AM

## 2018-08-16 NOTE — Progress Notes (Signed)
   PATIENT ID: Candice Nolan   1 Day Post-Op Procedure(s) (LRB): REVERSE TOTAL SHOULDER ARTHROPLASTY (Left)  Subjective: Doing well, min pain. No complaints or concerns. Hopes to d/c to [facility] today.   Objective:  Vitals:   08/16/18 0133 08/16/18 0536  BP: 110/77 (!) 125/56  Pulse: 81 66  Resp: 16 18  Temp: 97.9 F (36.6 C) 98 F (36.7 C)  SpO2: 95% 100%     L UE dressing c/d/i Ice machine on  Wiggles fingers, some distal swelling in hand  Labs:  Recent Labs    08/16/18 0248  HGB 9.6*   Recent Labs    08/16/18 0248  WBC 10.8*  RBC 3.19*  HCT 31.3*  PLT 198   Recent Labs    08/16/18 0248  NA 138  K 4.0  CL 106  CO2 24  BUN 19  CREATININE 0.63  GLUCOSE 118*  CALCIUM 8.2*    Assessment and Plan: 1 day s/p L rev TSA OT- hand, wrist elbow rom only D/c to [facility] when cleared by OT Minimize narcotics Fu w chandler in 2 weeks  VTE proph: asa, scds

## 2018-08-16 NOTE — Plan of Care (Signed)
Patient ready for discharge. 

## 2018-08-16 NOTE — TOC Transition Note (Signed)
Transition of Care [facility]) - CM/SW Discharge Note   Patient Details  Name: Candice Nolan MRN: [id number] Date of Birth: 02/15/1940  [facility] of [facility]) CM/SW Contact:  Leeroy Cha, RN Phone Number: 08/16/2018, 10:28 AM   Clinical Narrative:    dcd to home with husband and self care.   Final next level of care: Home/Self Care Barriers to Discharge: No Barriers Identified   Patient Goals and CMS Choice Patient states their goals for this hospitalization and ongoing recovery are:: to back home CMS Medicare.gov Compare Post Acute Care list provided to:: Patient    Discharge Placement                       Discharge Plan and Services                                     Social Determinants of Health (SDOH) Interventions     Readmission Risk Interventions No flowsheet data found.

## 2018-11-07 ENCOUNTER — Other Ambulatory Visit: Payer: Self-pay | Admitting: Orthopedic Surgery

## 2018-12-04 NOTE — Patient Instructions (Addendum)
DUE TO COVID-19 ONLY ONE VISITOR IS ALLOWED TO COME WITH YOU AND STAY IN THE WAITING ROOM ONLY DURING PRE OP AND PROCEDURE DAY OF SURGERY. THE 1 VISITOR MAY VISIT WITH YOU AFTER SURGERY IN YOUR PRIVATE ROOM DURING VISITING HOURS ONLY!   ONCE YOUR COVID TEST IS COMPLETED, PLEASE BEGIN THE QUARANTINE INSTRUCTIONS AS OUTLINED IN YOUR HANDOUT.                Candice Nolan     Your procedure is scheduled on: Monday 12/09/2018   Report to [facility] Main  Entrance    Report to Admitting  at  [place]   AM     Call this number if you have problems the morning of surgery [phone]    Remember: Do not eat food  :After Midnight.     NO SOLID FOOD AFTER MIDNIGHT THE NIGHT PRIOR TO SURGERY. NOTHING BY MOUTH EXCEPT CLEAR LIQUIDS UNTIL  0650 am .     PLEASE FINISH ENSURE DRINK PER SURGEON ORDER  WHICH NEEDS TO BE COMPLETED AT  [place] am. .   CLEAR LIQUID DIET   Foods Allowed                                                                     Foods Excluded  Coffee and tea, regular and decaf                             liquids that you cannot  Plain Jell-O any favor except red or purple                                           see through such as: Fruit ices (not with fruit pulp)                                     milk, soups, orange juice  Iced Popsicles                                    All solid food Carbonated beverages, regular and diet                                    Cranberry, grape and apple juices Sports drinks like Gatorade Lightly seasoned clear broth or consume(fat free) Sugar, honey syrup  Sample Menu Breakfast                                Lunch                                     Supper Cranberry juice  Beef broth                            Chicken broth Jell-O                                     Grape juice                           Apple juice Coffee or tea                        Jell-O                                      Popsicle                                                Coffee or tea                        Coffee or tea  _____________________________________________________________________     BRUSH YOUR TEETH MORNING OF SURGERY AND RINSE YOUR MOUTH OUT, NO CHEWING GUM CANDY OR MINTS.     Take these medicines the morning of surgery with A SIP OF WATER: Desvenlafaxine (Pristiq), Omeprazole (Prilosec)                                 You may not have any metal on your body including hair pins and              piercings  Do not wear jewelry, make-up, lotions, powders or perfumes, deodorant             Do not wear nail polish on your fingernails.  Do not shave  48 hours prior to surgery.                 Do not bring valuables to the hospital. [place].  Contacts, dentures or bridgework may not be worn into surgery.  Leave suitcase in the car. After surgery it may be brought to your room.                  Please read over the following fact sheets you were given: _____________________________________________________________________             [facility] - Preparing for Surgery Before surgery, you can play an important role.  Because skin is not sterile, your skin needs to be as free of germs as possible.  You can reduce the number of germs on your skin by washing with CHG (chlorahexidine gluconate) soap before surgery.  CHG is an antiseptic cleaner which kills germs and bonds with the skin to continue killing germs even after washing. Please DO NOT use if you have an allergy to CHG or antibacterial soaps.  If your skin becomes reddened/irritated stop using the CHG and inform your nurse when you arrive at Short Stay. Do not  shave (including legs and underarms) for at least 48 hours prior to the first CHG shower.  You may shave your face/neck. Please follow these instructions carefully:  1.  Shower with CHG Soap the night before surgery and the  morning of  Surgery.  2.  If you choose to wash your hair, wash your hair first as usual with your  normal  shampoo.  3.  After you shampoo, rinse your hair and body thoroughly to remove the  shampoo.                           4.  Use CHG as you would any other liquid soap.  You can apply chg directly  to the skin and wash                       Gently with a scrungie or clean washcloth.  5.  Apply the CHG Soap to your body ONLY FROM THE NECK DOWN.   Do not use on face/ open                           Wound or open sores. Avoid contact with eyes, ears mouth and genitals (private parts).                       Wash face,  Genitals (private parts) with your normal soap.             6.  Wash thoroughly, paying special attention to the area where your surgery  will be performed.  7.  Thoroughly rinse your body with warm water from the neck down.  8.  DO NOT shower/wash with your normal soap after using and rinsing off  the CHG Soap.                9.  Pat yourself dry with a clean towel.            10.  Wear clean pajamas.            11.  Place clean sheets on your bed the night of your first shower and do not  sleep with pets. Day of Surgery : Do not apply any lotions/deodorants the morning of surgery.  Please wear clean clothes to the hospital/surgery center.  FAILURE TO FOLLOW THESE INSTRUCTIONS MAY RESULT IN THE CANCELLATION OF YOUR SURGERY PATIENT SIGNATURE_________________________________  NURSE SIGNATURE__________________________________  ________________________________________________________________________   Candice Nolan  An incentive spirometer is a tool that can help keep your lungs clear and active. This tool measures how well you are filling your lungs with each breath. Taking long deep breaths may help reverse or decrease the chance of developing breathing (pulmonary) problems (especially infection) following:  A long period of time when you are unable to move or be active. BEFORE  THE PROCEDURE   If the spirometer includes an indicator to show your best effort, your nurse or respiratory therapist will set it to a desired goal.  If possible, sit up straight or lean slightly forward. Try not to slouch.  Hold the incentive spirometer in an upright position. INSTRUCTIONS FOR USE  1. Sit on the edge of your bed if possible, or sit up as far as you can in bed or on a chair. 2. Hold the incentive spirometer in an upright position. 3. Breathe out normally. 4. Place the mouthpiece in your  mouth and seal your lips tightly around it. 5. Breathe in slowly and as deeply as possible, raising the piston or the ball toward the top of the column. 6. Hold your breath for 3-5 seconds or for as long as possible. Allow the piston or ball to fall to the bottom of the column. 7. Remove the mouthpiece from your mouth and breathe out normally. 8. Rest for a few seconds and repeat Steps 1 through 7 at least 10 times every 1-2 hours when you are awake. Take your time and take a few normal breaths between deep breaths. 9. The spirometer may include an indicator to show your best effort. Use the indicator as a goal to work toward during each repetition. 10. After each set of 10 deep breaths, practice coughing to be sure your lungs are clear. If you have an incision (the cut made at the time of surgery), support your incision when coughing by placing a pillow or rolled up towels firmly against it. Once you are able to get out of bed, walk around indoors and cough well. You may stop using the incentive spirometer when instructed by your caregiver.  RISKS AND COMPLICATIONS  Take your time so you do not get dizzy or light-headed.  If you are in pain, you may need to take or ask for pain medication before doing incentive spirometry. It is harder to take a deep breath if you are having pain. AFTER USE  Rest and breathe slowly and easily.  It can be helpful to keep track of a log of your progress.  Your caregiver can provide you with a simple table to help with this. If you are using the spirometer at home, follow these instructions: [place] IF:   You are having difficultly using the spirometer.  You have trouble using the spirometer as often as instructed.  Your pain medication is not giving enough relief while using the spirometer.  You develop fever of 100.5 F (38.1 C) or higher. SEEK IMMEDIATE MEDICAL CARE IF:   You cough up bloody sputum that had not been present before.  You develop fever of 102 F (38.9 C) or greater.  You develop worsening pain at or near the incision site. MAKE SURE YOU:   Understand these instructions.  Will watch your condition.  Will get help right away if you are not doing well or get worse. Document Released: 05/01/2006 Document Revised: 03/13/2011 Document Reviewed: 07/02/2006 ExitCare Patient Information 2014 ExitCare, [place].   ________________________________________________________________________  WHAT IS A BLOOD TRANSFUSION? Blood Transfusion Information  A transfusion is the replacement of blood or some of its parts. Blood is made up of multiple cells which provide different functions.  Red blood cells carry oxygen and are used for blood loss replacement.  White blood cells fight against infection.  Platelets control bleeding.  Plasma helps clot blood.  Other blood products are available for specialized needs, such as hemophilia or other clotting disorders. BEFORE THE TRANSFUSION  Who gives blood for transfusions?   Healthy volunteers who are fully evaluated to make sure their blood is safe. This is blood bank blood. Transfusion therapy is the safest it has ever been in the practice of medicine. Before blood is taken from a donor, a complete history is taken to make sure that person has no history of diseases nor engages in risky social behavior (examples are intravenous drug use or sexual activity with multiple  partners). The donor's travel history is screened to minimize risk of  transmitting infections, such as malaria. The donated blood is tested for signs of infectious diseases, such as HIV and hepatitis. The blood is then tested to be sure it is compatible with you in order to minimize the chance of a transfusion reaction. If you or a relative donates blood, this is often done in anticipation of surgery and is not appropriate for emergency situations. It takes many days to process the donated blood. RISKS AND COMPLICATIONS Although transfusion therapy is very safe and saves many lives, the main dangers of transfusion include:   Getting an infectious disease.  Developing a transfusion reaction. This is an allergic reaction to something in the blood you were given. Every precaution is taken to prevent this. The decision to have a blood transfusion has been considered carefully by your caregiver before blood is given. Blood is not given unless the benefits outweigh the risks. AFTER THE TRANSFUSION  Right after receiving a blood transfusion, you will usually feel much better and more energetic. This is especially true if your red blood cells have gotten low (anemic). The transfusion raises the level of the red blood cells which carry oxygen, and this usually causes an energy increase.  The nurse administering the transfusion will monitor you carefully for complications. HOME CARE INSTRUCTIONS  No special instructions are needed after a transfusion. You may find your energy is better. Speak with your caregiver about any limitations on activity for underlying diseases you may have. SEEK MEDICAL CARE IF:   Your condition is not improving after your transfusion.  You develop redness or irritation at the intravenous (IV) site. SEEK IMMEDIATE MEDICAL CARE IF:  Any of the following symptoms occur over the next 12 hours:  Shaking chills.  You have a temperature by mouth above 102 F (38.9 C), not  controlled by medicine.  Chest, back, or muscle pain.  People around you feel you are not acting correctly or are confused.  Shortness of breath or difficulty breathing.  Dizziness and fainting.  You get a rash or develop hives.  You have a decrease in urine output.  Your urine turns a dark color or changes to pink, red, or brown. Any of the following symptoms occur over the next 10 days:  You have a temperature by mouth above 102 F (38.9 C), not controlled by medicine.  Shortness of breath.  Weakness after normal activity.  The white part of the eye turns yellow (jaundice).  You have a decrease in the amount of urine or are urinating less often.  Your urine turns a dark color or changes to pink, red, or brown. Document Released: 12/17/1999 Document Revised: 03/13/2011 Document Reviewed: 08/05/2007 [facility] Patient Information 2014 [place], [place].  _______________________________________________________________________

## 2018-12-05 ENCOUNTER — Other Ambulatory Visit (HOSPITAL_COMMUNITY)
Admission: RE | Admit: 2018-12-05 | Discharge: 2018-12-05 | Disposition: A | Discharge status: Home / Self Care | Payer: Medicare Other | Source: Ambulatory Visit | Attending: Orthopedic Surgery | Admitting: Orthopedic Surgery

## 2018-12-05 DIAGNOSIS — Z20828 Contact with and (suspected) exposure to other viral communicable diseases: Secondary | ICD-10-CM | POA: Diagnosis not present

## 2018-12-05 DIAGNOSIS — M1712 Unilateral primary osteoarthritis, left knee: Secondary | ICD-10-CM | POA: Diagnosis present

## 2018-12-05 DIAGNOSIS — Z01812 Encounter for preprocedural laboratory examination: Secondary | ICD-10-CM | POA: Diagnosis present

## 2018-12-05 NOTE — H&P (Signed)
TOTAL KNEE ADMISSION H&P  Patient is being admitted for left total knee arthroplasty.  Subjective:  Chief Complaint:left knee pain.  HPI: Candice Nolan, 78 y.o. female, has a history of pain and functional disability in the left knee due to arthritis and has failed non-surgical conservative treatments for greater than 12 weeks to includeNSAID's and/or analgesics, corticosteriod injections, viscosupplementation injections, flexibility and strengthening excercises, use of assistive devices and activity modification.  Onset of symptoms was gradual, starting several years ago with gradually worsening course since that time. The patient noted prior procedures on the knee to include  arthroscopy on the left knee(s).  Patient currently rates pain in the left knee(s) at 10 out of 10 with activity. Patient has night pain, worsening of pain with activity and weight bearing, pain that interferes with activities of daily living, pain with passive range of motion, crepitus and joint swelling.  Patient has evidence of subchondral sclerosis and joint space narrowing by imaging studies.   There is no active infection.  Patient Active Problem List   Diagnosis Date Noted  . 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 S/P reverse total shoulder arthroplasty, right 01/18/2017   Past Medical History:  Diagnosis Date  . Anxiety   . Arthritis   . 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 Cancer ([place])    basal cell skin cancer removed  . Complication of anesthesia    elevated temp post-op '70, no problems since. Received Suprane and sccinylcholine 03/15/09 w/o issue.   . Depression   . GERD (gastroesophageal reflux disease)   . H/O bladder infections   . Incontinence    at young age , and bowel as a young adult . Had bladder reconstruction and part of colon was used as top of bladder . Pt. self caths 4 x a day  . Macular degeneration   . Pneumonia    as a child     Past Surgical History:  Procedure Laterality Date  . ABDOMINAL HYSTERECTOMY    . APPENDECTOMY    . BICEPS TENDON REPAIR    . BLADDER AUGMENTATION    . carpel tunnel Bilateral    x2  . EYE SURGERY     bilateral cataracts  . KNEE ARTHROSCOPY Bilateral    numerous times  . REVERSE SHOULDER ARTHROPLASTY Right 01/18/2017  . REVERSE SHOULDER ARTHROPLASTY Right 01/18/2017   Procedure: REVERSE SHOULDER ARTHROPLASTY;  Surgeon: Tania Ade, MD;  Location: [facility];  Service: Orthopedics;  Laterality: Right;  Right reverse total shoulder arthroplasty  . TONSILLECTOMY    . TOTAL SHOULDER ARTHROPLASTY Left 08/15/2018   Procedure: REVERSE TOTAL SHOULDER ARTHROPLASTY;  Surgeon: Tania Ade, MD;  Location: [facility];  Service: Orthopedics;  Laterality: Left;  . TUBAL LIGATION      No current facility-administered medications for this encounter.    Current Outpatient Medications  Medication Sig Dispense Refill Last Dose  . acetaminophen (TYLENOL) 650 MG CR tablet Take 650 mg by mouth every 8 (eight) hours as needed for pain.     . Aflibercept (EYLEA IO) Inject into the eye See admin instructions. Every 8-10 weeks     . ALPRAZolam (XANAX) 0.25 MG tablet Take 0.125 mg by mouth See admin instructions. take 1/2 tab 30 minutes prior to eye injections every 8 weeks     . aspirin EC 81 MG tablet Take 81 mg by mouth daily.     . caffeine (STAY AWAKE MAXIMUM STRENGTH) 200 MG TABS  tablet Take 100 mg by mouth every 4 (four) hours as needed (alertness).     . CALCIUM-VITAMIN D PO Take 1 tablet by mouth every evening.      Marland Kitchen CRANBERRY PO Take 650 mg by mouth daily.      Marland Kitchen desvenlafaxine (PRISTIQ) 50 MG 24 hr tablet Take 50 mg by mouth daily. Take with 50mg  tablet=75 mg total daily dose     . Desvenlafaxine Succinate ER 25 MG TB24 Take 25 mg by mouth daily. Take with 50mg  tablet=75 mg total daily dose     . HYDROcodone-acetaminophen (NORCO/VICODIN) 5-325 MG tablet Take 0.5 tablets by mouth every 6 (six) hours  as needed for moderate pain. 20 tablet 0   . methylphenidate (RITALIN) 10 MG tablet Take 10 mg by mouth daily. Take with 20mg  tablet=30 mg total daily dose     . methylphenidate (RITALIN) 20 MG tablet Take 20 mg by mouth daily. Take with 10mg  tablet=30 mg total daily dose     . MOBIC 15 MG tablet Take 15 mg by mouth daily as needed.     . Multiple Vitamin (MULTIVITAMIN WITH MINERALS) TABS tablet Take 1 tablet by mouth daily.     . Multiple Vitamins-Minerals (PRESERVISION AREDS 2+MULTI VIT) CAPS Take 1 tablet by mouth 2 (two) times daily.     Marland Kitchen omeprazole (PRILOSEC) 20 MG capsule Take 20 mg by mouth daily.     . Probiotic Product (ALIGN) 4 MG CAPS Take 4 mg by mouth every evening.       Allergies  Allergen Reactions  . Ascorbate Swelling    Facial ASORBIC ACID - can not have in pill form - foods are ok  . Contrast Media [Iodinated Diagnostic Agents] Shortness Of Breath  . Iodine Shortness Of Breath  . Chloramphenicol     UNSPECIFIED REACTION    . Pentazocine Anxiety    [place]   . Tyloxapol Anxiety    Social History   Tobacco Use  . Smoking status: Never Smoker  . Smokeless tobacco: Never Used  Substance Use Topics  . Alcohol use: Yes    Comment: 1 drink a week    No family history on file.   Review of Systems  Constitutional: Positive for diaphoresis and malaise/fatigue.  HENT: Positive for tinnitus.   Eyes: Positive for blurred vision.  Respiratory: Negative.   Cardiovascular: Negative.   Gastrointestinal: Negative.   Genitourinary: Negative.   Musculoskeletal: Positive for joint pain and myalgias.  Skin: Negative.   Neurological: Negative.   Endo/Heme/Allergies: Negative.   Psychiatric/Behavioral: The patient is nervous/anxious.     Objective:  Physical Exam  Constitutional: She is oriented to person, place, and time. She appears well-developed and well-nourished.  HENT:  Head: Normocephalic and atraumatic.  Eyes: Pupils are equal, round, and reactive to light.   Neck: Normal range of motion. Neck supple.  Cardiovascular: Normal rate and regular rhythm.  Musculoskeletal:        General: Tenderness present.     Comments: patient has a range from 0-115.  Obvious valgus deformity.  No instability.  She has tenderness over the lateral joint line.  Moderate crepitance with range of motion.  Patient's right knee continues to have good strength good range of motion and no pain.  Neurological: She is alert and oriented to person, place, and time.  Skin: Skin is warm and dry.  Psychiatric: She has a normal mood and affect. Her behavior is normal. Judgment and thought content normal.    Vital  signs in last 24 hours: BP: ()/()  Arterial Line BP: ()/()   Labs:   Estimated body mass index is 32.25 kg/m as calculated from the following:   Height as of 08/15/18: 4\' 9"  (1.448 m).   Weight as of 08/15/18: 67.6 kg.   Imaging Review Plain radiographs demonstrate  bilateral AP weightbearing, bilateral Rosenberg, lateral and sunrise views of the left knee are taken and reviewed in office today.  Patient has end-stage bone-on-bone arthritis of the lateral compartment with 10-15 valgus deformity.   Assessment/Plan:  End stage arthritis, left knee   The patient history, physical examination, clinical judgment of the provider and imaging studies are consistent with end stage degenerative joint disease of the left knee(s) and total knee arthroplasty is deemed medically necessary. The treatment options including medical management, injection therapy arthroscopy and arthroplasty were discussed at length. The risks and benefits of total knee arthroplasty were presented and reviewed. The risks due to aseptic loosening, infection, stiffness, patella tracking problems, thromboembolic complications and other imponderables were discussed. The patient acknowledged the explanation, agreed to proceed with the plan and consent was signed. Patient is being admitted for inpatient  treatment for surgery, pain control, PT, OT, prophylactic antibiotics, VTE prophylaxis, progressive ambulation and ADL's and discharge planning. The patient is planning to be discharged home with home health services     Patient's anticipated LOS is less than 2 midnights, meeting these requirements: - Younger than 5 - Lives within 1 hour of care - Has a competent adult at home to recover with post-op recover - NO history of  - Chronic pain requiring opiods  - Diabetes  - Coronary Artery Disease  - Heart failure  - Heart attack  - Stroke  - DVT/VTE  - Cardiac arrhythmia  - Respiratory Failure/COPD  - Renal failure  - Anemia  - Advanced Liver disease

## 2018-12-06 ENCOUNTER — Encounter (HOSPITAL_COMMUNITY): Payer: Self-pay

## 2018-12-06 ENCOUNTER — Encounter (HOSPITAL_COMMUNITY)
Admission: RE | Admit: 2018-12-06 | Discharge: 2018-12-06 | Disposition: A | Discharge status: Home / Self Care | Payer: Medicare Other | Source: Ambulatory Visit | Attending: Orthopedic Surgery | Admitting: Orthopedic Surgery

## 2018-12-06 ENCOUNTER — Ambulatory Visit (HOSPITAL_COMMUNITY)
Admission: RE | Admit: 2018-12-06 | Discharge: 2018-12-06 | Disposition: A | Discharge status: Home / Self Care | Payer: Medicare Other | Source: Ambulatory Visit | Attending: Orthopedic Surgery | Admitting: Orthopedic Surgery

## 2018-12-06 ENCOUNTER — Other Ambulatory Visit: Payer: Self-pay

## 2018-12-06 DIAGNOSIS — Z01818 Encounter for other preprocedural examination: Secondary | ICD-10-CM | POA: Insufficient documentation

## 2018-12-06 DIAGNOSIS — K219 Gastro-esophageal reflux disease without esophagitis: Secondary | ICD-10-CM | POA: Insufficient documentation

## 2018-12-06 DIAGNOSIS — M1712 Unilateral primary osteoarthritis, left knee: Secondary | ICD-10-CM | POA: Insufficient documentation

## 2018-12-06 DIAGNOSIS — Z79899 Other long term (current) drug therapy: Secondary | ICD-10-CM | POA: Insufficient documentation

## 2018-12-06 DIAGNOSIS — Z7982 Long term (current) use of aspirin: Secondary | ICD-10-CM | POA: Insufficient documentation

## 2018-12-06 HISTORY — DX: Sleep related leg cramps: G47.62

## 2018-12-06 LAB — URINALYSIS, ROUTINE W REFLEX MICROSCOPIC
Bilirubin Urine: NEGATIVE
Glucose, UA: NEGATIVE mg/dL
Hgb urine dipstick: NEGATIVE
Ketones, ur: NEGATIVE mg/dL
Nitrite: POSITIVE — AB
Protein, ur: NEGATIVE mg/dL
Specific Gravity, Urine: 1.019 (ref 1.005–1.030)
WBC, UA: >50 WBC/hpf — ABNORMAL HIGH (ref 0–5)
pH: 5 (ref 5.0–8.0)

## 2018-12-06 LAB — CBC WITH DIFFERENTIAL/PLATELET
Abs Immature Granulocytes: 0.03 10*3/uL (ref 0.00–0.07)
Basophils Absolute: 0.1 10*3/uL (ref 0.0–0.1)
Basophils Relative: 1 %
Eosinophils Absolute: 0.1 10*3/uL (ref 0.0–0.5)
Eosinophils Relative: 2 %
HCT: 37.4 % (ref 36.0–46.0)
Hemoglobin: 11.7 g/dL — ABNORMAL LOW (ref 12.0–15.0)
Immature Granulocytes: 0 %
Lymphocytes Relative: 15 %
Lymphs Abs: 1.1 10*3/uL (ref 0.7–4.0)
MCH: 29 pg (ref 26.0–34.0)
MCHC: 31.3 g/dL (ref 30.0–36.0)
MCV: 92.6 fL (ref 80.0–100.0)
Monocytes Absolute: 0.5 10*3/uL (ref 0.1–1.0)
Monocytes Relative: 7 %
Neutro Abs: 5.5 10*3/uL (ref 1.7–7.7)
Neutrophils Relative %: 75 %
Platelets: 283 10*3/uL (ref 150–400)
RBC: 4.04 MIL/uL (ref 3.87–5.11)
RDW: 14.1 % (ref 11.5–15.5)
WBC: 7.3 10*3/uL (ref 4.0–10.5)
nRBC: 0 % (ref 0.0–0.2)

## 2018-12-06 LAB — BASIC METABOLIC PANEL
Anion gap: 9 (ref 5–15)
BUN: 25 mg/dL — ABNORMAL HIGH (ref 8–23)
CO2: 23 mmol/L (ref 22–32)
Calcium: 9 mg/dL (ref 8.9–10.3)
Chloride: 107 mmol/L (ref 98–111)
Creatinine, Ser: 0.54 mg/dL (ref 0.44–1.00)
GFR calc Af Amer: >60 mL/min (ref >60)
GFR calc non Af Amer: >60 mL/min (ref >60)
Glucose, Bld: 90 mg/dL (ref 70–99)
Potassium: 4.2 mmol/L (ref 3.5–5.1)
Sodium: 139 mmol/L (ref 135–145)

## 2018-12-06 LAB — APTT: aPTT: 27 seconds (ref 24–36)

## 2018-12-06 LAB — SURGICAL PCR SCREEN
MRSA, PCR: NEGATIVE
Staphylococcus aureus: NEGATIVE

## 2018-12-06 LAB — PROTIME-INR
INR: 0.8 (ref 0.8–1.2)
Prothrombin Time: 11.3 seconds — ABNORMAL LOW (ref 11.4–15.2)

## 2018-12-06 NOTE — Care Plan (Signed)
Spoke with patient prior to surgery. She is planning to discharge to the short term rehab center at [facility]. She will need a complete FL2 prior to discharge to go here. Her husband will provide transportation. She has all needed equipment at home.   Ladell Heads, [place]

## 2018-12-06 NOTE — Progress Notes (Signed)
PCP - Dr. Garth Schlatter Cardiologist - N/A  Chest x-ray -08/09/2018 2 view EPIC  EKG - 08/09/2018 EPIC Stress Test - about 20 years ago and she never finished the stress test ECHO -N/A  Cardiac Cath - N/A  Opthamologist- Dr. Dennard Nip  [place]  Sleep Study - N/A CPAP - N/A  Fasting Blood Sugar - N/A Checks Blood Sugar __0___ times a day  Blood Thinner Instructions:N/A Aspirin Instructions:Aspirin  EC 81 mg- patient states she was never told to stop the Aspirin from Dr. Shaune Spittle office. Instructed patient to call Dr. Damita Dunnings office to get instructions. Patient verbalized understanding. Last Dose:12/06/2018  Anesthesia review:   Patient has a history of macular degeneration  Patient denies shortness of breath, fever, cough and chest pain at PAT appointment   Patient verbalized understanding of instructions that were given to them at the PAT appointment. Patient was also instructed that they will need to review over the PAT instructions again at home before surgery.

## 2018-12-07 LAB — ABO/RH: ABO/RH(D): A NEG

## 2018-12-08 LAB — NOVEL CORONAVIRUS, NAA (HOSP ORDER, SEND-OUT TO REF LAB; TAT 18-24 HRS): SARS-CoV-2, NAA: NOT DETECTED

## 2018-12-08 MED ORDER — TRANEXAMIC ACID 1000 MG/10ML IV SOLN
2000.0000 mg | INTRAVENOUS | Status: DC
  Filled 2018-12-08: qty 20

## 2018-12-08 MED ORDER — BUPIVACAINE LIPOSOME 1.3 % IJ SUSP
20.0000 mL | Freq: Once | INTRAMUSCULAR | Status: DC
  Filled 2018-12-08: qty 20

## 2018-12-09 ENCOUNTER — Ambulatory Visit (HOSPITAL_COMMUNITY): Payer: Medicare Other | Admitting: Certified Registered Nurse Anesthetist

## 2018-12-09 ENCOUNTER — Ambulatory Visit (HOSPITAL_COMMUNITY): Payer: Medicare Other | Admitting: Physician Assistant

## 2018-12-09 ENCOUNTER — Encounter (HOSPITAL_COMMUNITY): Payer: Self-pay | Admitting: Certified Registered Nurse Anesthetist

## 2018-12-09 ENCOUNTER — Other Ambulatory Visit: Payer: Self-pay

## 2018-12-09 ENCOUNTER — Ambulatory Visit (HOSPITAL_COMMUNITY)
Admission: RE | Admit: 2018-12-09 | Discharge: 2018-12-11 | Disposition: A | Discharge status: Skilled Nursing Facility | Payer: Medicare Other | Attending: Orthopedic Surgery | Admitting: Orthopedic Surgery

## 2018-12-09 ENCOUNTER — Encounter (HOSPITAL_COMMUNITY)
Admission: RE | Disposition: A | Discharge status: Skilled Nursing Facility | Payer: Self-pay | Source: Home / Self Care | Attending: Orthopedic Surgery

## 2018-12-09 DIAGNOSIS — Z79899 Other long term (current) drug therapy: Secondary | ICD-10-CM | POA: Diagnosis not present

## 2018-12-09 DIAGNOSIS — Z7982 Long term (current) use of aspirin: Secondary | ICD-10-CM | POA: Diagnosis not present

## 2018-12-09 DIAGNOSIS — Z791 Long term (current) use of non-steroidal anti-inflammatories (NSAID): Secondary | ICD-10-CM | POA: Insufficient documentation

## 2018-12-09 DIAGNOSIS — F329 Major depressive disorder, single episode, unspecified: Secondary | ICD-10-CM | POA: Diagnosis not present

## 2018-12-09 DIAGNOSIS — D62 Acute posthemorrhagic anemia: Secondary | ICD-10-CM | POA: Insufficient documentation

## 2018-12-09 DIAGNOSIS — Z888 Allergy status to other drugs, medicaments and biological substances status: Secondary | ICD-10-CM | POA: Diagnosis not present

## 2018-12-09 DIAGNOSIS — F419 Anxiety disorder, unspecified: Secondary | ICD-10-CM | POA: Insufficient documentation

## 2018-12-09 DIAGNOSIS — R32 Unspecified urinary incontinence: Secondary | ICD-10-CM | POA: Diagnosis not present

## 2018-12-09 DIAGNOSIS — Z96652 Presence of left artificial knee joint: Secondary | ICD-10-CM

## 2018-12-09 DIAGNOSIS — Z20828 Contact with and (suspected) exposure to other viral communicable diseases: Secondary | ICD-10-CM | POA: Diagnosis not present

## 2018-12-09 DIAGNOSIS — Z96612 Presence of left artificial shoulder joint: Secondary | ICD-10-CM | POA: Insufficient documentation

## 2018-12-09 DIAGNOSIS — K219 Gastro-esophageal reflux disease without esophagitis: Secondary | ICD-10-CM | POA: Diagnosis not present

## 2018-12-09 DIAGNOSIS — M1712 Unilateral primary osteoarthritis, left knee: Secondary | ICD-10-CM | POA: Diagnosis not present

## 2018-12-09 DIAGNOSIS — Z96611 Presence of right artificial shoulder joint: Secondary | ICD-10-CM | POA: Insufficient documentation

## 2018-12-09 HISTORY — PX: TOTAL KNEE ARTHROPLASTY: SHX125

## 2018-12-09 LAB — TYPE AND SCREEN
ABO/RH(D): A NEG
Antibody Screen: NEGATIVE

## 2018-12-09 LAB — PROTIME-INR
INR: 0.8 (ref 0.8–1.2)
Prothrombin Time: 11.4 seconds (ref 11.4–15.2)

## 2018-12-09 SURGERY — ARTHROPLASTY, KNEE, TOTAL
Anesthesia: Spinal | Site: Knee | Laterality: Left

## 2018-12-09 MED ORDER — ASPIRIN 81 MG PO CHEW
81.0000 mg | CHEWABLE_TABLET | Freq: Two times a day (BID) | ORAL | Status: DC
  Administered 2018-12-09 – 2018-12-11 (×4): 81 mg via ORAL
  Filled 2018-12-09 (×4): qty 1

## 2018-12-09 MED ORDER — MEPERIDINE HCL 50 MG/ML IJ SOLN: 6.2500 mg | INTRAMUSCULAR | Status: DC | PRN

## 2018-12-09 MED ORDER — FENTANYL CITRATE (PF) 100 MCG/2ML IJ SOLN
INTRAMUSCULAR | Status: AC
  Administered 2018-12-09: 50 ug via INTRAVENOUS
  Filled 2018-12-09: qty 2

## 2018-12-09 MED ORDER — METOCLOPRAMIDE HCL 5 MG/ML IJ SOLN: 5.0000 mg | Freq: Three times a day (TID) | INTRAMUSCULAR | Status: DC | PRN

## 2018-12-09 MED ORDER — DEXAMETHASONE SODIUM PHOSPHATE 10 MG/ML IJ SOLN
INTRAMUSCULAR | Status: AC
  Filled 2018-12-09: qty 1

## 2018-12-09 MED ORDER — ASPIRIN EC 81 MG PO TBEC: 81.0000 mg | DELAYED_RELEASE_TABLET | Freq: Two times a day (BID) | ORAL | 0 refills | Status: DC

## 2018-12-09 MED ORDER — HYDROMORPHONE HCL 1 MG/ML IJ SOLN: 0.5000 mg | INTRAMUSCULAR | Status: DC | PRN

## 2018-12-09 MED ORDER — DOCUSATE SODIUM 100 MG PO CAPS
100.0000 mg | ORAL_CAPSULE | Freq: Two times a day (BID) | ORAL | Status: DC
  Administered 2018-12-09 – 2018-12-11 (×4): 100 mg via ORAL
  Filled 2018-12-09 (×4): qty 1

## 2018-12-09 MED ORDER — LACTATED RINGERS IV SOLN
INTRAVENOUS | Status: DC
  Administered 2018-12-09 (×2): via INTRAVENOUS

## 2018-12-09 MED ORDER — FENTANYL CITRATE (PF) 100 MCG/2ML IJ SOLN
INTRAMUSCULAR | Status: AC
  Filled 2018-12-09: qty 2

## 2018-12-09 MED ORDER — TRANEXAMIC ACID 1000 MG/10ML IV SOLN
INTRAVENOUS | Status: DC | PRN
  Administered 2018-12-09: 2000 mg via TOPICAL

## 2018-12-09 MED ORDER — ONDANSETRON HCL 4 MG/2ML IJ SOLN
INTRAMUSCULAR | Status: AC
  Filled 2018-12-09: qty 2

## 2018-12-09 MED ORDER — BUPIVACAINE-EPINEPHRINE (PF) 0.25% -1:200000 IJ SOLN
INTRAMUSCULAR | Status: DC | PRN
  Administered 2018-12-09: 30 mL

## 2018-12-09 MED ORDER — OXYCODONE-ACETAMINOPHEN 5-325 MG PO TABS: 1.0000 | ORAL_TABLET | ORAL | 0 refills | Status: DC | PRN

## 2018-12-09 MED ORDER — PHENYLEPHRINE HCL (PRESSORS) 10 MG/ML IV SOLN
INTRAVENOUS | Status: AC
  Filled 2018-12-09: qty 1

## 2018-12-09 MED ORDER — SODIUM CHLORIDE (PF) 0.9 % IJ SOLN
INTRAMUSCULAR | Status: DC | PRN
  Administered 2018-12-09: 70 mL

## 2018-12-09 MED ORDER — ONDANSETRON HCL 4 MG/2ML IJ SOLN: 4.0000 mg | Freq: Four times a day (QID) | INTRAMUSCULAR | Status: DC | PRN

## 2018-12-09 MED ORDER — BUPIVACAINE LIPOSOME 1.3 % IJ SUSP
INTRAMUSCULAR | Status: DC | PRN
  Administered 2018-12-09: 20 mL

## 2018-12-09 MED ORDER — DESVENLAFAXINE SUCCINATE ER 50 MG PO TB24: 50.0000 mg | ORAL_TABLET | Freq: Every day | ORAL | Status: DC

## 2018-12-09 MED ORDER — FENTANYL CITRATE (PF) 100 MCG/2ML IJ SOLN
INTRAMUSCULAR | Status: DC | PRN
  Administered 2018-12-09 (×5): 50 ug via INTRAVENOUS

## 2018-12-09 MED ORDER — MENTHOL 3 MG MT LOZG: 1.0000 | LOZENGE | OROMUCOSAL | Status: DC | PRN

## 2018-12-09 MED ORDER — ACETAMINOPHEN 325 MG PO TABS
325.0000 mg | ORAL_TABLET | Freq: Four times a day (QID) | ORAL | Status: DC | PRN
  Administered 2018-12-10 – 2018-12-11 (×2): 650 mg via ORAL
  Filled 2018-12-09 (×2): qty 2

## 2018-12-09 MED ORDER — ROPIVACAINE HCL 7.5 MG/ML IJ SOLN
INTRAMUSCULAR | Status: DC | PRN
  Administered 2018-12-09: 25 mL via PERINEURAL

## 2018-12-09 MED ORDER — DEXAMETHASONE SODIUM PHOSPHATE 10 MG/ML IJ SOLN
10.0000 mg | Freq: Once | INTRAMUSCULAR | Status: AC
  Administered 2018-12-10: 10 mg via INTRAVENOUS
  Filled 2018-12-09: qty 1

## 2018-12-09 MED ORDER — BUPIVACAINE-EPINEPHRINE 0.25% -1:200000 IJ SOLN
INTRAMUSCULAR | Status: AC
  Filled 2018-12-09: qty 1

## 2018-12-09 MED ORDER — ONDANSETRON HCL 4 MG PO TABS: 4.0000 mg | ORAL_TABLET | Freq: Four times a day (QID) | ORAL | Status: DC | PRN

## 2018-12-09 MED ORDER — SODIUM CHLORIDE 0.9 % IR SOLN
Status: DC | PRN
  Administered 2018-12-09: 1000 mL

## 2018-12-09 MED ORDER — LIDOCAINE 2% (20 MG/ML) 5 ML SYRINGE
INTRAMUSCULAR | Status: AC
  Filled 2018-12-09: qty 5

## 2018-12-09 MED ORDER — PHENOL 1.4 % MT LIQD: 1.0000 | OROMUCOSAL | Status: DC | PRN

## 2018-12-09 MED ORDER — ONDANSETRON HCL 4 MG/2ML IJ SOLN: 4.0000 mg | Freq: Once | INTRAMUSCULAR | Status: DC | PRN

## 2018-12-09 MED ORDER — ONDANSETRON HCL 4 MG/2ML IJ SOLN
INTRAMUSCULAR | Status: DC | PRN
  Administered 2018-12-09: 4 mg via INTRAVENOUS

## 2018-12-09 MED ORDER — OXYCODONE HCL 5 MG PO TABS
5.0000 mg | ORAL_TABLET | ORAL | Status: DC | PRN
  Administered 2018-12-09: 10 mg via ORAL
  Administered 2018-12-09: 5 mg via ORAL
  Administered 2018-12-10 – 2018-12-11 (×7): 10 mg via ORAL
  Filled 2018-12-09 (×3): qty 2
  Filled 2018-12-09: qty 1
  Filled 2018-12-09 (×5): qty 2

## 2018-12-09 MED ORDER — DESVENLAFAXINE SUCCINATE ER 25 MG PO TB24: 25.0000 mg | ORAL_TABLET | Freq: Every day | ORAL | Status: DC

## 2018-12-09 MED ORDER — METOCLOPRAMIDE HCL 5 MG PO TABS: 5.0000 mg | ORAL_TABLET | Freq: Three times a day (TID) | ORAL | Status: DC | PRN

## 2018-12-09 MED ORDER — SODIUM CHLORIDE (PF) 0.9 % IJ SOLN
INTRAMUSCULAR | Status: AC
  Filled 2018-12-09: qty 50

## 2018-12-09 MED ORDER — METHOCARBAMOL 500 MG IVPB - SIMPLE MED
500.0000 mg | Freq: Four times a day (QID) | INTRAVENOUS | Status: DC | PRN
  Filled 2018-12-09: qty 50

## 2018-12-09 MED ORDER — KCL IN DEXTROSE-NACL 20-5-0.45 MEQ/L-%-% IV SOLN
INTRAVENOUS | Status: DC
  Administered 2018-12-09: 15:00:00 via INTRAVENOUS
  Filled 2018-12-09 (×3): qty 1000

## 2018-12-09 MED ORDER — METHYLPHENIDATE HCL 10 MG PO TABS
20.0000 mg | ORAL_TABLET | Freq: Every day | ORAL | Status: DC
  Administered 2018-12-10 – 2018-12-11 (×2): 20 mg via ORAL
  Filled 2018-12-09 (×2): qty 2

## 2018-12-09 MED ORDER — ALUM & MAG HYDROXIDE-SIMETH 200-200-20 MG/5ML PO SUSP: 30.0000 mL | ORAL | Status: DC | PRN

## 2018-12-09 MED ORDER — BISACODYL 5 MG PO TBEC: 5.0000 mg | DELAYED_RELEASE_TABLET | Freq: Every day | ORAL | Status: DC | PRN

## 2018-12-09 MED ORDER — VENLAFAXINE HCL ER 37.5 MG PO CP24
37.5000 mg | ORAL_CAPSULE | Freq: Every day | ORAL | Status: DC
  Administered 2018-12-10 – 2018-12-11 (×2): 37.5 mg via ORAL
  Filled 2018-12-09 (×2): qty 1

## 2018-12-09 MED ORDER — METHOCARBAMOL 500 MG PO TABS
500.0000 mg | ORAL_TABLET | Freq: Four times a day (QID) | ORAL | Status: DC | PRN
  Administered 2018-12-09 – 2018-12-11 (×7): 500 mg via ORAL
  Filled 2018-12-09 (×7): qty 1

## 2018-12-09 MED ORDER — FENTANYL CITRATE (PF) 100 MCG/2ML IJ SOLN
25.0000 ug | INTRAMUSCULAR | Status: DC | PRN
  Administered 2018-12-09 (×2): 50 ug via INTRAVENOUS

## 2018-12-09 MED ORDER — PANTOPRAZOLE SODIUM 40 MG PO TBEC
40.0000 mg | DELAYED_RELEASE_TABLET | Freq: Every day | ORAL | Status: DC
  Administered 2018-12-10 – 2018-12-11 (×2): 40 mg via ORAL
  Filled 2018-12-09 (×2): qty 1

## 2018-12-09 MED ORDER — POVIDONE-IODINE 10 % EX SWAB: 2.0000 "application " | Freq: Once | CUTANEOUS | Status: DC

## 2018-12-09 MED ORDER — ACETAMINOPHEN 160 MG/5ML PO SOLN: 325.0000 mg | ORAL | Status: DC | PRN

## 2018-12-09 MED ORDER — ACETAMINOPHEN 325 MG PO TABS: 325.0000 mg | ORAL_TABLET | ORAL | Status: DC | PRN

## 2018-12-09 MED ORDER — SODIUM CHLORIDE (PF) 0.9 % IJ SOLN
INTRAMUSCULAR | Status: AC
  Filled 2018-12-09: qty 20

## 2018-12-09 MED ORDER — FENTANYL CITRATE (PF) 100 MCG/2ML IJ SOLN
50.0000 ug | INTRAMUSCULAR | Status: DC
  Administered 2018-12-09: 50 ug via INTRAVENOUS
  Filled 2018-12-09: qty 2

## 2018-12-09 MED ORDER — LIDOCAINE 2% (20 MG/ML) 5 ML SYRINGE
INTRAMUSCULAR | Status: DC | PRN
  Administered 2018-12-09: 80 mg via INTRAVENOUS

## 2018-12-09 MED ORDER — TRANEXAMIC ACID-NACL 1000-0.7 MG/100ML-% IV SOLN
1000.0000 mg | INTRAVENOUS | Status: AC
  Administered 2018-12-09: 1000 mg via INTRAVENOUS
  Filled 2018-12-09: qty 100

## 2018-12-09 MED ORDER — TIZANIDINE HCL 2 MG PO TABS: 2.0000 mg | ORAL_TABLET | Freq: Four times a day (QID) | ORAL | 0 refills | Status: DC | PRN

## 2018-12-09 MED ORDER — PHENYLEPHRINE HCL-NACL 10-0.9 MG/250ML-% IV SOLN
INTRAVENOUS | Status: DC | PRN
  Administered 2018-12-09: 50 ug/min via INTRAVENOUS

## 2018-12-09 MED ORDER — OXYCODONE HCL 5 MG/5ML PO SOLN: 5.0000 mg | Freq: Once | ORAL | Status: DC | PRN

## 2018-12-09 MED ORDER — FLEET ENEMA 7-19 GM/118ML RE ENEM: 1.0000 | ENEMA | Freq: Once | RECTAL | Status: DC | PRN

## 2018-12-09 MED ORDER — ALPRAZOLAM 0.25 MG PO TABS: 0.1250 mg | ORAL_TABLET | ORAL | Status: DC

## 2018-12-09 MED ORDER — PROPOFOL 10 MG/ML IV BOLUS
INTRAVENOUS | Status: DC | PRN
  Administered 2018-12-09: 150 mg via INTRAVENOUS

## 2018-12-09 MED ORDER — PROPOFOL 10 MG/ML IV BOLUS
INTRAVENOUS | Status: AC
  Filled 2018-12-09: qty 20

## 2018-12-09 MED ORDER — VENLAFAXINE HCL ER 75 MG PO CP24
75.0000 mg | ORAL_CAPSULE | Freq: Every day | ORAL | Status: DC
  Administered 2018-12-10 – 2018-12-11 (×2): 75 mg via ORAL
  Filled 2018-12-09 (×2): qty 1

## 2018-12-09 MED ORDER — DIPHENHYDRAMINE HCL 12.5 MG/5ML PO ELIX: 12.5000 mg | ORAL_SOLUTION | ORAL | Status: DC | PRN

## 2018-12-09 MED ORDER — GABAPENTIN 100 MG PO CAPS
100.0000 mg | ORAL_CAPSULE | Freq: Three times a day (TID) | ORAL | Status: DC
  Administered 2018-12-09 – 2018-12-11 (×6): 100 mg via ORAL
  Filled 2018-12-09 (×6): qty 1

## 2018-12-09 MED ORDER — MIDAZOLAM HCL 2 MG/2ML IJ SOLN
1.0000 mg | INTRAMUSCULAR | Status: DC
  Administered 2018-12-09: 2 mg via INTRAVENOUS
  Filled 2018-12-09: qty 2

## 2018-12-09 MED ORDER — CEFAZOLIN SODIUM-DEXTROSE 2-4 GM/100ML-% IV SOLN
2.0000 g | INTRAVENOUS | Status: AC
  Administered 2018-12-09: 10:00:00 2 g via INTRAVENOUS
  Filled 2018-12-09: qty 100

## 2018-12-09 MED ORDER — DEXAMETHASONE SODIUM PHOSPHATE 4 MG/ML IJ SOLN
INTRAMUSCULAR | Status: DC | PRN
  Administered 2018-12-09: 10 mg via PERINEURAL

## 2018-12-09 MED ORDER — POLYETHYLENE GLYCOL 3350 17 G PO PACK
17.0000 g | PACK | Freq: Every day | ORAL | Status: DC | PRN
  Administered 2018-12-10: 17 g via ORAL
  Filled 2018-12-09: qty 1

## 2018-12-09 MED ORDER — METHYLPHENIDATE HCL 10 MG PO TABS
10.0000 mg | ORAL_TABLET | Freq: Every day | ORAL | Status: DC
  Administered 2018-12-10 – 2018-12-11 (×2): 10 mg via ORAL
  Filled 2018-12-09 (×2): qty 1

## 2018-12-09 MED ORDER — CHLORHEXIDINE GLUCONATE 4 % EX LIQD: 60.0000 mL | Freq: Once | CUTANEOUS | Status: DC

## 2018-12-09 MED ORDER — TRANEXAMIC ACID-NACL 1000-0.7 MG/100ML-% IV SOLN
1000.0000 mg | Freq: Once | INTRAVENOUS | Status: AC
  Administered 2018-12-09: 1000 mg via INTRAVENOUS
  Filled 2018-12-09: qty 100

## 2018-12-09 MED ORDER — OXYCODONE HCL 5 MG PO TABS: 5.0000 mg | ORAL_TABLET | Freq: Once | ORAL | Status: DC | PRN

## 2018-12-09 SURGICAL SUPPLY — 57 items
ATTUNE PS FEM LT SZ 4 CEM KNEE (Femur) ×2 IMPLANT
ATTUNE PSRP INSR SZ4 5 KNEE (Insert) ×1 IMPLANT
ATTUNE PSRP INSR SZ4 5MM KNEE (Insert) ×1 IMPLANT
BAG DECANTER FOR FLEXI CONT (MISCELLANEOUS) ×3 IMPLANT
BAG SPEC THK2 15X12 ZIP CLS (MISCELLANEOUS) ×1
BAG ZIPLOCK 12X15 (MISCELLANEOUS) ×3 IMPLANT
BASE TIBIAL ROT PLAT SZ 5 KNEE (Knees) IMPLANT
BLADE SAG 18X100X1.27 (BLADE) ×3 IMPLANT
BLADE SAW SGTL 11.0X1.19X90.0M (BLADE) ×3 IMPLANT
BLADE SURG SZ10 CARB STEEL (BLADE) ×6 IMPLANT
BNDG CMPR MED 10X6 ELC LF (GAUZE/BANDAGES/DRESSINGS) ×1
BNDG ELASTIC 6X10 VLCR STRL LF (GAUZE/BANDAGES/DRESSINGS) ×3 IMPLANT
BOWL SMART MIX CTS (DISPOSABLE) ×3 IMPLANT
BSPLAT TIB 5 CMNT ROT PLAT STR (Knees) ×1 IMPLANT
CEMENT HV SMART SET (Cement) ×6 IMPLANT
COVER SURGICAL LIGHT HANDLE (MISCELLANEOUS) ×3 IMPLANT
COVER WAND RF STERILE (DRAPES) IMPLANT
CUFF TOURN SGL QUICK 34 (TOURNIQUET CUFF) ×3
CUFF TRNQT CYL 34X4.125X (TOURNIQUET CUFF) ×1 IMPLANT
DECANTER SPIKE VIAL GLASS SM (MISCELLANEOUS) ×9 IMPLANT
DRAPE U-SHAPE 47X51 STRL (DRAPES) ×3 IMPLANT
DRSG AQUACEL AG ADV 3.5X10 (GAUZE/BANDAGES/DRESSINGS) ×3 IMPLANT
DURAPREP 26ML APPLICATOR (WOUND CARE) ×3 IMPLANT
ELECT REM PT RETURN 15FT ADLT (MISCELLANEOUS) ×3 IMPLANT
GLOVE BIO SURGEON STRL SZ7.5 (GLOVE) ×3 IMPLANT
GLOVE BIO SURGEON STRL SZ8.5 (GLOVE) ×3 IMPLANT
GLOVE BIOGEL PI IND STRL 8 (GLOVE) ×1 IMPLANT
GLOVE BIOGEL PI IND STRL 9 (GLOVE) ×1 IMPLANT
GLOVE BIOGEL PI INDICATOR 8 (GLOVE) ×2
GLOVE BIOGEL PI INDICATOR 9 (GLOVE) ×2
GOWN STRL REUS W/TWL XL LVL3 (GOWN DISPOSABLE) ×6 IMPLANT
HANDPIECE INTERPULSE COAX TIP (DISPOSABLE) ×3
HOLDER FOLEY CATH W/STRAP (MISCELLANEOUS) ×2 IMPLANT
HOOD PEEL AWAY FLYTE STAYCOOL (MISCELLANEOUS) ×9 IMPLANT
KIT TURNOVER KIT A (KITS) ×2 IMPLANT
NDL HYPO 21X1.5 SAFETY (NEEDLE) ×2 IMPLANT
NEEDLE HYPO 21X1.5 SAFETY (NEEDLE) ×3 IMPLANT
NS IRRIG 1000ML POUR BTL (IV SOLUTION) ×1 IMPLANT
PACK ICE MAXI GEL EZY WRAP (MISCELLANEOUS) ×1 IMPLANT
PACK TOTAL KNEE CUSTOM (KITS) ×3 IMPLANT
PATELLA MEDIAL ATTUN 35MM KNEE (Knees) ×2 IMPLANT
PENCIL SMOKE EVACUATOR (MISCELLANEOUS) ×2 IMPLANT
PIN DRILL FIX HALF THREAD (BIT) ×2 IMPLANT
PIN STEINMAN FIXATION KNEE (PIN) ×2 IMPLANT
PROTECTOR NERVE ULNAR (MISCELLANEOUS) ×3 IMPLANT
SET HNDPC FAN SPRY TIP SCT (DISPOSABLE) ×1 IMPLANT
SUT VIC AB 1 CTX 36 (SUTURE) ×3
SUT VIC AB 1 CTX36XBRD ANBCTR (SUTURE) ×1 IMPLANT
SUT VIC AB 3-0 CT1 27 (SUTURE) ×9
SUT VIC AB 3-0 CT1 TAPERPNT 27 (SUTURE) ×3 IMPLANT
SYR CONTROL 10ML LL (SYRINGE) ×6 IMPLANT
TIBIAL BASE ROT PLAT SZ 5 KNEE (Knees) ×3 IMPLANT
TRAY FOLEY MTR SLVR 14FR STAT (SET/KITS/TRAYS/PACK) ×2 IMPLANT
TRAY FOLEY MTR SLVR 16FR STAT (SET/KITS/TRAYS/PACK) ×3 IMPLANT
WATER STERILE IRR 1000ML POUR (IV SOLUTION) ×6 IMPLANT
WRAP KNEE MAXI GEL POST OP (GAUZE/BANDAGES/DRESSINGS) ×2 IMPLANT
YANKAUER SUCT BULB TIP 10FT TU (MISCELLANEOUS) ×3 IMPLANT

## 2018-12-09 NOTE — Discharge Instructions (Signed)

## 2018-12-09 NOTE — Progress Notes (Signed)
Assisted Dr. Oddono with left, ultrasound guided, adductor canal block. Side rails up, monitors on throughout procedure. See vital signs in flow sheet. Tolerated Procedure well.  

## 2018-12-09 NOTE — Evaluation (Signed)
Physical Therapy Evaluation Patient Details Name: Candice Nolan MRN: [id number] DOB: Apr 27, 1940 Today's Date: 12/09/2018   History of Present Illness  Pt is 78 yo female s/p R TKA on 12/09/18.  Pt with hx of bil total shoulder replacement ( R Reverse TSA 3 months ago).  See history and physical for full PMH.  Clinical Impression  Pt is s/p R TKA on DOS resulting in the deficits listed below (see PT Problem List). Pt very motivated and with good pain control today.  She demonstrated good ROM and quad activation for DOS. She was able to ambulate with min guard for 30'.  Pt will benefit from skilled PT to increase their independence and safety with mobility to allow discharge to the venue listed below.      Follow Up Recommendations Follow surgeon's recommendation for DC plan and follow-up therapies(pt has made plans to go to [facility] for SNF (she lives in the independent living there already))    Equipment Recommendations  (youth RW (if not going to rehab))    Recommendations for Other Services       Precautions / Restrictions Restrictions Weight Bearing Restrictions: No      Mobility  Bed Mobility Overal bed mobility: Needs Assistance Bed Mobility: Supine to Sit;Sit to Supine     Supine to sit: Supervision;HOB elevated Sit to supine: Supervision;HOB elevated   General bed mobility comments: bed rails and cues for technique  Transfers Overall transfer level: Needs assistance Equipment used: Rolling walker (2 wheeled) Transfers: Sit to/from Stand Sit to Stand: Min assist         General transfer comment: performed x 2; cues for safe hand placement  Ambulation/Gait Ambulation/Gait assistance: Min guard Gait Distance (Feet): 30 Feet Assistive device: Rolling walker (2 wheeled) Gait Pattern/deviations: Step-to pattern;Decreased stride length;Decreased weight shift to right Gait velocity: decreased   General Gait Details: cue for RW distance and  sequence  Stairs            Wheelchair Mobility    Modified Rankin (Stroke Patients Only)       Balance Overall balance assessment: Needs assistance Sitting-balance support: Bilateral upper extremity supported;Feet supported Sitting balance-Leahy Scale: Normal     Standing balance support: Bilateral upper extremity supported;During functional activity Standing balance-Leahy Scale: Fair                               Pertinent Vitals/Pain Pain Assessment: 0-10 Pain Score: 4  Pain Location: R knee Pain Descriptors / Indicators: Discomfort Pain Intervention(s): Ice applied;Repositioned;Relaxation    Home Living Family/patient expects to be discharged to:: Skilled nursing facility([facility]) Living Arrangements: Spouse/significant other Available Help at Discharge: Family;Available 24 hours/day Type of Home: Independent living facility-at [facility] Home Access: Level entry     Home Layout: One level Home Equipment: Shower seat;Hand held shower head;Grab bars - toilet;Grab bars - tub/shower;Cane - single point      Prior Function           Comments: Reports hx of falls; Pt independent with all ADLs and IADLs; could walk community distances     Hand Dominance        Extremity/Trunk Assessment   Upper Extremity Assessment Upper Extremity Assessment: Overall WFL for tasks assessed((shoulder elevation to ~100 degrees))    Lower Extremity Assessment Lower Extremity Assessment: RLE deficits/detail RLE Deficits / Details: ROM: ankle and hip WFL, knee 5 to 90; MMT: ankle DF 5/5; knee and hip 3/5  not further tested due to recent sx RLE Sensation: WNL       Communication   Communication: No difficulties  Cognition Arousal/Alertness: Awake/alert Behavior During Therapy: WFL for tasks assessed/performed Overall Cognitive Status: Within Functional Limits for tasks assessed                                        General Comments       Exercises Total Joint Exercises Ankle Circles/Pumps: AROM;Both;10 reps;Seated Quad Sets: AROM;Right;10 reps;Seated Heel Slides: AAROM;Right;10 reps;Seated Goniometric ROM: R knee lacked 5 ext; 90 flex   Assessment/Plan    PT Assessment Patient needs continued PT services  PT Problem List Decreased strength;Decreased mobility;Decreased safety awareness;Decreased range of motion;Decreased activity tolerance;Decreased balance;Decreased knowledge of use of DME       PT Treatment Interventions DME instruction;Therapeutic activities;Modalities;Gait training;Therapeutic exercise;Patient/family education;Stair training;Balance training;Functional mobility training    PT Goals (Current goals can be found in the Care Plan section)  Acute Rehab PT Goals Patient Stated Goal: [facility] SNF at d/c then return to independent living PT Goal Formulation: With patient Time For Goal Achievement: 12/23/18 Potential to Achieve Goals: Good    Frequency 7X/week   Barriers to discharge Decreased caregiver support spouse not able to provide physical assist    Co-evaluation               AM-PAC PT "6 Clicks" Mobility  Outcome Measure Help needed turning from your back to your side while in a flat bed without using bedrails?: None Help needed moving from lying on your back to sitting on the side of a flat bed without using bedrails?: None Help needed moving to and from a bed to a chair (including a wheelchair)?: None Help needed standing up from a chair using your arms (e.g., wheelchair or bedside chair)?: A Little Help needed to walk in hospital room?: A Little Help needed climbing 3-5 steps with a railing? : A Little 6 Click Score: 21    End of Session Equipment Utilized During Treatment: Gait belt Activity Tolerance: Patient tolerated treatment well Patient left: in chair;with chair alarm set;with call bell/phone within reach Nurse Communication: Mobility status;Patient requests  pain meds PT Visit Diagnosis: Other abnormalities of gait and mobility (R26.89);Muscle weakness (generalized) (M62.81)    Time: [id number] PT Time Calculation (min) (ACUTE ONLY): 34 min   Charges:   PT Evaluation $PT Eval Low Complexity: 1 Low PT Treatments $Therapeutic Exercise: 8-22 mins        Maggie Font, PT Acute Rehab Services Pager [phone] [facility] [phone] [facility] [phone]   Karlton Lemon 12/09/2018, 4:21 PM

## 2018-12-09 NOTE — Anesthesia Procedure Notes (Signed)
Anesthesia Regional Block: Adductor canal block   Pre-Anesthetic Checklist: ,, timeout performed, Correct Patient, Correct Site, Correct Laterality, Correct Procedure, Correct Position, site marked, Risks and benefits discussed,  Surgical consent,  Pre-op evaluation,  At surgeon's request and post-op pain management  Laterality: Left  Prep: chloraprep       Needles:  Injection technique: Single-shot  Needle Type: Echogenic Stimulator Needle     Needle Length: 5cm  Needle Gauge: 22     Additional Needles:   Procedures:, nerve stimulator,,, ultrasound used (permanent image in chart),,,,  Narrative:  Start time: 12/09/2018 9:05 AM End time: 12/09/2018 9:12 AM Injection made incrementally with aspirations every 5 mL.  Performed by: Personally  Anesthesiologist: Janeece Riggers, MD  Additional Notes: Functioning IV was confirmed and monitors were applied.  A 56mm 22ga Arrow echogenic stimulator needle was used. Sterile prep and drape,hand hygiene and sterile gloves were used. Ultrasound guidance: relevant anatomy identified, needle position confirmed, local anesthetic spread visualized around nerve(s)., vascular puncture avoided.  Image printed for medical record. Negative aspiration and negative test dose prior to incremental administration of local anesthetic. The patient tolerated the procedure well.

## 2018-12-09 NOTE — NC FL2 (Addendum)
[place] LEVEL OF CARE SCREENING TOOL     IDENTIFICATION  Patient Name: Candice Nolan Birthdate: 1940-11-27 Sex: female Admission Date (Current Location): 12/09/2018  [facility] and [place] Number:  Herbalist and Address:  [facility],  [place], [place]      Provider Number: [id number]  Attending Physician Name and Address:  Frederik Pear, MD  Relative Name and Phone Number:     Candice, Nolan [phone]  [phone]       Current Level of Care: Hospital Recommended Level of Care: [place] Prior Approval Number:    Date Approved/Denied:   PASRR Number: Pending  Discharge Plan: SNF    Current Diagnoses: Patient Active Problem List   Diagnosis Date Noted  . S/P total knee arthroplasty, left 12/09/2018  . Degenerative arthritis of left knee 12/05/2018  . 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 S/P reverse total shoulder arthroplasty, right 01/18/2017    Orientation RESPIRATION BLADDER Height & Weight     Self, Time, Situation, Place  Normal Continent Weight: 144 lb 12.8 oz (65.7 kg) Height:  4\' 9"  (144.8 cm)  BEHAVIORAL SYMPTOMS/MOOD NEUROLOGICAL BOWEL NUTRITION STATUS      Continent Diet Regular  AMBULATORY STATUS COMMUNICATION OF NEEDS Skin   Extensive Assist Verbally Surgical wounds                       Personal Care Assistance Level of Assistance  Bathing, Feeding, Dressing Bathing Assistance: Limited assistance Feeding assistance: Independent Dressing Assistance: Limited assistance     Functional Limitations Info  Sight, Hearing, Speech Sight Info: Impaired Hearing Info: Adequate Speech Info: Adequate    SPECIAL CARE FACTORS FREQUENCY  PT (By licensed PT), OT (By licensed OT)     PT Frequency: 5x/week OT Frequency: 5x/week            Contractures Contractures Info: Not present    Additional Factors Info  Code Status, Allergies  Code Status Info: Fullcode Allergies Info: Allergies: Ascorbate, Contrast Media Iodinated Diagnostic Agents, Iodine, Chloramphenicol, Pentazocine, Tyloxapol           Current Medications (12/09/2018):  This is the current hospital active medication list Current Facility-Administered Medications  Medication Dose Route Frequency Provider Last Rate Last Dose  . [START ON 12/10/2018] acetaminophen (TYLENOL) tablet 325-650 mg  325-650 mg Oral Q6H PRN Leighton Parody, PA-C      . alum & mag hydroxide-simeth (MAALOX/MYLANTA) 200-200-20 MG/5ML suspension 30 mL  30 mL Oral Q4H PRN Leighton Parody, PA-C      . aspirin chewable tablet 81 mg  81 mg Oral BID Leighton Parody, PA-C      . bisacodyl (DULCOLAX) EC tablet 5 mg  5 mg Oral Daily PRN Leighton Parody, PA-C      . [START ON 12/10/2018] dexamethasone (DECADRON) injection 10 mg  10 mg Intravenous Once Joanell Rising K, PA-C      . dextrose 5 % and 0.45 % NaCl with KCl 20 mEq/L infusion   Intravenous Continuous Leighton Parody, PA-C      . diphenhydrAMINE (BENADRYL) 12.5 MG/5ML elixir 12.5-25 mg  12.5-25 mg Oral Q4H PRN Leighton Parody, PA-C      . docusate sodium (COLACE) capsule 100 mg  100 mg Oral BID Leighton Parody, PA-C      . gabapentin (NEURONTIN) capsule 100 mg  100 mg Oral TID Leighton Parody, PA-C      .  HYDROmorphone (DILAUDID) injection 0.5-1 mg  0.5-1 mg Intravenous Q4H PRN Leighton Parody, PA-C      . menthol-cetylpyridinium (CEPACOL) lozenge 3 mg  1 lozenge Oral PRN Leighton Parody, PA-C       Or  . phenol (CHLORASEPTIC) mouth spray 1 spray  1 spray Mouth/Throat PRN Leighton Parody, PA-C      . methocarbamol (ROBAXIN) tablet 500 mg  500 mg Oral Q6H PRN Leighton Parody, PA-C   500 mg at 12/09/18 1401   Or  . methocarbamol (ROBAXIN) 500 mg in dextrose 5 % 50 mL IVPB  500 mg Intravenous Q6H PRN Leighton Parody, PA-C      . [START ON 12/10/2018] methylphenidate (RITALIN) tablet 10 mg  10 mg Oral Daily Joanell Rising K, PA-C       . [START ON 12/10/2018] methylphenidate (RITALIN) tablet 20 mg  20 mg Oral Daily Joanell Rising K, PA-C      . metoCLOPramide (REGLAN) tablet 5-10 mg  5-10 mg Oral Q8H PRN Leighton Parody, PA-C       Or  . metoCLOPramide (REGLAN) injection 5-10 mg  5-10 mg Intravenous Q8H PRN Leighton Parody, PA-C      . ondansetron [facility]) tablet 4 mg  4 mg Oral Q6H PRN Leighton Parody, PA-C       Or  . ondansetron [facility]) injection 4 mg  4 mg Intravenous Q6H PRN Leighton Parody, PA-C      . oxyCODONE (Oxy IR/ROXICODONE) immediate release tablet 5-10 mg  5-10 mg Oral Q4H PRN Leighton Parody, PA-C      . pantoprazole (PROTONIX) EC tablet 40 mg  40 mg Oral Daily Joanell Rising K, PA-C      . polyethylene glycol (MIRALAX / GLYCOLAX) packet 17 g  17 g Oral Daily PRN Leighton Parody, PA-C      . sodium phosphate (FLEET) 7-19 GM/118ML enema 1 enema  1 enema Rectal Once PRN Leighton Parody, PA-C      . tranexamic acid (CYKLOKAPRON) IVPB 1,000 mg  1,000 mg Intravenous Once Joanell Rising K, PA-C      . venlafaxine XR (EFFEXOR-XR) 24 hr capsule 75 mg  75 mg Oral Daily Frederik Pear, MD       And  . venlafaxine XR (EFFEXOR-XR) 24 hr capsule 37.5 mg  37.5 mg Oral Daily Frederik Pear, MD         Discharge Medications: Please see discharge summary for a list of discharge medications.  Relevant Imaging Results:  Relevant Lab Results:   Additional Information [id number]  Lia Hopping, LCSW

## 2018-12-09 NOTE — Plan of Care (Signed)
POC initiated 

## 2018-12-09 NOTE — Anesthesia Procedure Notes (Signed)
Procedure Name: LMA Insertion Date/Time: 12/09/2018 9:55 AM Performed by: Mitzie Na, CRNA Pre-anesthesia Checklist: Patient identified, Emergency Drugs available, Suction available and Patient being monitored Patient Re-evaluated:Patient Re-evaluated prior to induction Oxygen Delivery Method: Circle system utilized Preoxygenation: Pre-oxygenation with 100% oxygen Induction Type: IV induction LMA: LMA inserted LMA Size: 4.0 Number of attempts: 1 Airway Equipment and Method: Stylet and Oral airway Placement Confirmation: positive ETCO2 and breath sounds checked- equal and bilateral Tube secured with: Tape Dental Injury: Teeth and Oropharynx as per pre-operative assessment

## 2018-12-09 NOTE — Op Note (Signed)
PATIENT ID:      EUN SURRATT  MRN:     [id number] DOB/AGE:    [birth year] / 78 y.o.       OPERATIVE REPORT   DATE OF PROCEDURE:  12/09/2018      PREOPERATIVE DIAGNOSIS:   LEFT KNEE OSTEOARTHRITIS      Estimated body mass index is 31.33 kg/m as calculated from the following:   Height as of 12/06/18: 4\' 9"  (1.448 m).   Weight as of 12/06/18: 65.7 kg.                                                       POSTOPERATIVE DIAGNOSIS:   Same                                                                     PROCEDURE:  Procedure(s): LEFT TOTAL KNEE ARTHROPLASTY Using DepuyAttune RP implants #4L Femur, #5Tibia, 5 mm Attune RP bearing, 35 Patella    SURGEON: Kerin Salen  ASSISTANT:   Kerry Hough. [employer]   (Present and scrubbed throughout the case, critical for assistance with exposure, retraction, instrumentation, and closure.)        ANESTHESIA: GET, 20cc Exparel, 50cc 0.25% Marcaine EBL: 300 cc FLUID REPLACEMENT: 1600 cc crystaloid TOURNIQUET: DRAINS: None TRANEXAMIC ACID: 1gm IV, 2gm topical COMPLICATIONS:  None         INDICATIONS FOR PROCEDURE: The patient has  LEFT KNEE OSTEOARTHRITIS, Val deformities, XR shows bone on bone arthritis, lateral subluxation of tibia. Patient has failed all conservative measures including anti-inflammatory medicines, narcotics, attempts at exercise and weight loss, cortisone injections and viscosupplementation.  Risks and benefits of surgery have been discussed, questions answered.   DESCRIPTION OF PROCEDURE: The patient identified by armband, received  IV antibiotics, in the holding area at [facility]. Patient taken to the operating room, appropriate anesthetic monitors were attached, and GET anesthesia was  induced. IV Tranexamic acid was given.Tourniquet applied high to the operative thigh. Lateral post and foot positioner applied to the table, the lower extremity was then prepped and draped in usual sterile fashion from the toes to the  tourniquet. Time-out procedure was performed. The skin and subcutaneous tissue along the incision was injected with 20 cc of a mixture of Exparel and Marcaine solution, using a 20-gauge by 1-1/2 inch needle. We began the operation, with the knee flexed 130 degrees, by making the anterior midline incision starting at handbreadth above the patella going over the patella 1 cm medial to and 4 cm distal to the tibial tubercle. Small bleeders in the skin and the subcutaneous tissue identified and cauterized. Transverse retinaculum was incised and reflected medially and a medial parapatellar arthrotomy was accomplished. the patella was everted and theprepatellar fat pad resected. The superficial medial collateral ligament was then elevated from anterior to posterior along the proximal flare of the tibia and anterior half of the menisci resected. The knee was hyperflexed exposing bone on bone arthritis. Peripheral and notch osteophytes as well as the cruciate ligaments were then resected. We continued to work our way around  posteriorly along the proximal tibia, and externally rotated the tibia subluxing it out from underneath the femur. A McHale PCL retractor was placed through the notch and a lateral Hohmann retractor placed, and we then entered the proximal tibia in line with the Depuy starter drill in line with the axis of the tibia followed by an intramedullary guide rod and 0-degree posterior slope cutting guide. The tibial cutting guide, 4 degree posterior sloped, was pinned into place allowing resection of 8 mm of bone medially and 0 mm of bone laterally. Satisfied with the tibial resection, we then entered the distal femur 2 mm anterior to the PCL origin with the intramedullary guide rod and applied the distal femoral cutting guide set at 9 mm, with 5 degrees of valgus. This was pinned along the epicondylar axis. At this point, the distal femoral cut was accomplished without difficulty. We then sized for a #4R  femoral component and pinned the guide in 0 degrees of external rotation. The chamfer cutting guide was pinned into place. The anterior, posterior, and chamfer cuts were accomplished without difficulty followed by the Attune RP box cutting guide and the box cut. We also removed posterior osteophytes from the posterior femoral condyles. The posterior capsule was injected with Exparel solution. The knee was brought into full extension. We checked our extension gap and fit a 5 mm bearing. Distracting in extension with a lamina spreader,  bleeders in the posterior capsule, Posterior medial and posterior lateral gutter were cauterized.  The transexamic acid-soaked sponge was then placed in the gap of the knee in extension. The knee was flexed 30. The posterior patella cut was accomplished with the 9.5 mm Attune cutting guide, sized for a 65mm dome, and the fixation pegs drilled.The knee was then once again hyperflexed exposing the proximal tibia. We sized for a # 5 tibial base plate, applied the smokestack and the conical reamer followed by the the Delta fin keel punch. We then hammered into place the Attune RP trial femoral component, drilled the lugs, inserted a  5 mm trial bearing, trial patellar button, and took the knee through range of motion from 0-130 degrees. Medial and lateral ligamentous stability was checked. No thumb pressure was required for patellar Tracking. The tourniquet was not used. All trial components were removed, mating surfaces irrigated with pulse lavage, and dried with suction and sponges. 10 cc of the Exparel solution was applied to the cancellus bone of the patella distal femur and proximal tibia.  After waiting 30 seconds, the bony surfaces were again, dried with sponges. A double batch of DePuy HV cement was mixed and applied to all bony metallic mating surfaces except for the posterior condyles of the femur itself. In order, we hammered into place the tibial tray and removed excess  cement, the femoral component and removed excess cement. The final Attune RP bearing was inserted, and the knee brought to full extension with compression. The patellar button was clamped into place, and excess cement removed. The knee was held at 30 flexion with compression, while the cement cured. The wound was irrigated out with normal saline solution pulse lavage. The rest of the Exparel was injected into the parapatellar arthrotomy, subcutaneous tissues, and periosteal tissues. The parapatellar arthrotomy was closed with running #1 Vicryl suture. The subcutaneous tissue with 0 and 2-0 undyed Vicryl suture, and the skin with running 3-0 SQ vicryl. An Aquacil and Ace wrap were applied. The patient was taken to recovery room without difficulty.   Kathalene Frames  Gearl Baratta 12/09/2018, 7:05 AM

## 2018-12-09 NOTE — Transfer of Care (Signed)
Immediate Anesthesia Transfer of Care Note  Patient: Candice Nolan  Procedure(s) Performed: LEFT TOTAL KNEE ARTHROPLASTY (Left Knee)  Patient Location: PACU  Anesthesia Type:General  Level of Consciousness: awake, alert , oriented and patient cooperative  Airway & Oxygen Therapy: Patient Spontanous Breathing and Patient connected to face mask oxygen  Post-op Assessment: Report given to RN, Post -op Vital signs reviewed and stable and Patient moving all extremities  Post vital signs: Reviewed and stable  Last Vitals:  Vitals Value Taken Time  BP 134/57 12/09/18 1150  Temp 36.7 C 12/09/18 1150  Pulse 93 12/09/18 1152  Resp 17 12/09/18 1152  SpO2 100 % 12/09/18 1152  Vitals shown include unvalidated device data.  Last Pain:  Vitals:   12/09/18 0758  TempSrc:   PainSc: 0-No pain      Patients Stated Pain Goal: 4 ([id number])  Complications: No apparent anesthesia complications

## 2018-12-09 NOTE — Anesthesia Preprocedure Evaluation (Addendum)
Anesthesia Evaluation  Patient identified by MRN, date of birth, ID band Patient awake    Reviewed: Allergy & Precautions, NPO status , Patient's Chart, lab work & pertinent test results  History of Anesthesia Complications (+) history of anesthetic complications  Airway Mallampati: II  TM Distance: >3 FB Neck ROM: Full    Dental no notable dental hx. (+) Poor Dentition, Chipped, Missing,    Pulmonary neg pulmonary ROS,    Pulmonary exam normal breath sounds clear to auscultation       Cardiovascular negative cardio ROS Normal cardiovascular exam Rhythm:Regular Rate:Normal     Neuro/Psych PSYCHIATRIC DISORDERS Anxiety Depression negative neurological ROS     GI/Hepatic Neg liver ROS, GERD  Medicated,  Endo/Other  negative endocrine ROS  Renal/GU negative Renal ROS  negative genitourinary   Musculoskeletal  (+) Arthritis , Osteoarthritis,    Abdominal   Peds negative pediatric ROS (+)  Hematology negative hematology ROS (+)   Anesthesia Other Findings   Reproductive/Obstetrics negative OB ROS                            Anesthesia Physical  Anesthesia Plan  ASA: III  Anesthesia Plan: General   Post-op Pain Management: GA combined w/ Regional for post-op pain   Induction: Intravenous  PONV Risk Score and Plan: 2 and Ondansetron, Treatment may vary due to age or medical condition and Dexamethasone  Airway Management Planned: Oral ETT and LMA  Additional Equipment:   Intra-op Plan:   Post-operative Plan: Extubation in OR  Informed Consent: I have reviewed the patients History and Physical, chart, labs and discussed the procedure including the risks, benefits and alternatives for the proposed anesthesia with the patient or authorized representative who has indicated his/her understanding and acceptance.     Dental advisory given  Plan Discussed with: CRNA, Surgeon and  Anesthesiologist  Anesthesia Plan Comments: (See PAT note 08/09/2018, Konrad Felix, PA-C)     Anesthesia Quick Evaluation

## 2018-12-09 NOTE — Interval H&P Note (Signed)
History and Physical Interval Note:  12/09/2018 8:35 AM  Candice Nolan  has presented today for surgery, with the diagnosis of LEFT KNEE OSTEOARTHRITIS.  The various methods of treatment have been discussed with the patient and family. After consideration of risks, benefits and other options for treatment, the patient has consented to  Procedure(s): LEFT TOTAL KNEE ARTHROPLASTY (Left) as a surgical intervention.  The patient's history has been reviewed, patient examined, no change in status, stable for surgery.  I have reviewed the patient's chart and labs.  Questions were answered to the patient's satisfaction.     Kerin Salen

## 2018-12-10 ENCOUNTER — Encounter (HOSPITAL_COMMUNITY): Payer: Self-pay | Admitting: Orthopedic Surgery

## 2018-12-10 DIAGNOSIS — M1712 Unilateral primary osteoarthritis, left knee: Secondary | ICD-10-CM | POA: Diagnosis not present

## 2018-12-10 LAB — CBC
HCT: 30 % — ABNORMAL LOW (ref 36.0–46.0)
Hemoglobin: 9.4 g/dL — ABNORMAL LOW (ref 12.0–15.0)
MCH: 29.2 pg (ref 26.0–34.0)
MCHC: 31.3 g/dL (ref 30.0–36.0)
MCV: 93.2 fL (ref 80.0–100.0)
Platelets: 221 10*3/uL (ref 150–400)
RBC: 3.22 MIL/uL — ABNORMAL LOW (ref 3.87–5.11)
RDW: 14.3 % (ref 11.5–15.5)
WBC: 12.9 10*3/uL — ABNORMAL HIGH (ref 4.0–10.5)
nRBC: 0 % (ref 0.0–0.2)

## 2018-12-10 LAB — BASIC METABOLIC PANEL
Anion gap: 7 (ref 5–15)
BUN: 14 mg/dL (ref 8–23)
CO2: 25 mmol/L (ref 22–32)
Calcium: 8.6 mg/dL — ABNORMAL LOW (ref 8.9–10.3)
Chloride: 106 mmol/L (ref 98–111)
Creatinine, Ser: 0.58 mg/dL (ref 0.44–1.00)
GFR calc Af Amer: >60 mL/min (ref >60)
GFR calc non Af Amer: >60 mL/min (ref >60)
Glucose, Bld: 151 mg/dL — ABNORMAL HIGH (ref 70–99)
Potassium: 4.4 mmol/L (ref 3.5–5.1)
Sodium: 138 mmol/L (ref 135–145)

## 2018-12-10 LAB — SARS CORONAVIRUS 2 (TAT 6-24 HRS): SARS Coronavirus 2: NEGATIVE

## 2018-12-10 NOTE — Progress Notes (Signed)
Physical Therapy Treatment Patient Details Name: Candice Nolan MRN: [id number] DOB: [birth year] Today's Date: 12/10/2018    History of Present Illness Pt is 78 yo female s/p R TKA on 12/09/18.  Pt with hx of bil total shoulder replacement ( R Reverse TSA 3 months ago).  See history and physical for full PMH.    PT Comments    Pt continues to have good ROM and quad activation.  Good participation with exercises.  Requires cues for technique and safety.  Pt reported fatigue and only able to ambulate in room.  Will progress as able.    Follow Up Recommendations  Follow surgeon's recommendation for DC plan and follow-up therapies     Equipment Recommendations  Other (comment)(youth RW if not going rehab)    Recommendations for Other Services       Precautions / Restrictions Precautions Precautions: Fall Restrictions LLE Weight Bearing: Weight bearing as tolerated    Mobility  Bed Mobility Overal bed mobility: Needs Assistance Bed Mobility: Supine to Sit     Supine to sit: Supervision;HOB elevated     General bed mobility comments: used gait belt as leg lifter to assist  Transfers Overall transfer level: Needs assistance Equipment used: Rolling walker (2 wheeled) Transfers: Sit to/from Stand Sit to Stand: Min guard         General transfer comment: performed x 2; cues for safe hand placement  Ambulation/Gait Ambulation/Gait assistance: Min guard Gait Distance (Feet): 20 Feet Assistive device: Rolling walker (2 wheeled) Gait Pattern/deviations: Step-to pattern;Decreased stride length;Decreased weight shift to right Gait velocity: decreased   General Gait Details: cue for RW distance and sequence; declined further ambulation due to [place]             Wheelchair Mobility    Modified Rankin (Stroke Patients Only)       Balance Overall balance assessment: Needs assistance Sitting-balance support: Bilateral upper extremity supported;Feet  supported Sitting balance-Leahy Scale: Normal     Standing balance support: Bilateral upper extremity supported;During functional activity Standing balance-Leahy Scale: Fair                              Cognition Arousal/Alertness: Awake/alert Behavior During Therapy: WFL for tasks assessed/performed Overall Cognitive Status: Within Functional Limits for tasks assessed                                        Exercises Total Joint Exercises Ankle Circles/Pumps: AROM;Both;10 reps;Supine Quad Sets: AROM;Right;10 reps;Supine Towel Squeeze: AROM;Both;10 reps;Supine Short Arc Quad: AROM;Left;10 reps;Supine Heel Slides: Supine;Left;10 reps;AAROM Hip ABduction/ADduction: AROM;Supine;Left;10 reps Straight Leg Raises: AAROM;Supine;Left;10 reps Knee Flexion: AROM;Left;10 reps Goniometric ROM: R knee ROM 0 to 80    General Comments        Pertinent Vitals/Pain Pain Assessment: 0-10 Pain Score: 4  Pain Location: R knee Pain Descriptors / Indicators: Discomfort Pain Intervention(s): Ice applied;Repositioned;Limited activity within patient's tolerance    Home Living                      Prior Function            PT Goals (current goals can now be found in the care plan section) Progress towards PT goals: Progressing toward goals    Frequency    7X/week      PT Plan  Current plan remains appropriate    Co-evaluation              AM-PAC PT "6 Clicks" Mobility   Outcome Measure  Help needed turning from your back to your side while in a flat bed without using bedrails?: None Help needed moving from lying on your back to sitting on the side of a flat bed without using bedrails?: None Help needed moving to and from a bed to a chair (including a wheelchair)?: None Help needed standing up from a chair using your arms (e.g., wheelchair or bedside chair)?: None Help needed to walk in hospital room?: None Help needed climbing 3-5  steps with a railing? : A Little 6 Click Score: 23    End of Session Equipment Utilized During Treatment: Gait belt Activity Tolerance: Patient limited by fatigue Patient left: with call bell/phone within reach;in bed;with bed alarm set Nurse Communication: Mobility status PT Visit Diagnosis: Other abnormalities of gait and mobility (R26.89);Muscle weakness (generalized) (M62.81)     Time: [id number] PT Time Calculation (min) (ACUTE ONLY): 32 min  Charges:  $Gait Training: 8-22 mins $Therapeutic Exercise: 8-22 mins                     Maggie Font, PT Acute Rehab Services Pager [phone] [facility] [phone] [facility] [phone]    Karlton Lemon 12/10/2018, 4:20 PM

## 2018-12-10 NOTE — TOC Initial Note (Signed)
Transition of Care [facility]) - Initial/Assessment Note    Patient Details  Name: Candice Nolan MRN: [id number] Date of Birth: Jul 27, 1940  [facility]) CM/SW Contact:    Lia Hopping, [place] Phone Number: 12/10/2018, 11:06 AM   Clinical Narrative:    Patient admitted for left total knee arthroplasty surgical intervention.     Patient is a Independent resident and will continue rehab at [facility].  CSW reached out to Rep. Whitney, the facility is expecting her. Patient has been proactive in her care and aware of the discharge plan. CSW completed FL2 and PASRR on 12/7.  Clinical information sent to [facility] insurance for authorization this am.  Patient spouse will transport.    Expected Discharge Plan: [place] Barriers to Discharge: No Barriers Identified   Patient Goals and CMS Choice Patient states their goals for this hospitalization and ongoing recovery are:: "rehab at [facility].gov Compare Post Acute Care list provided to:: Patient Choice offered to / list presented to : NA  Expected Discharge Plan and Services Expected Discharge Plan: [place] In-house Referral: Clinical Social Work Discharge Planning Services: CM Consult Post Acute Care Choice: [place] arrangements for the past 2 months: [place]                                      Prior Living Arrangements/Services Living arrangements for the past 2 months: [place] Lives with:: Spouse Patient language and need for interpreter reviewed:: Yes Do you feel safe going back to the place where you live?: Yes      Need for Family Participation in Patient Care: Yes (Comment) Care giver support system in place?: Yes (comment)   Criminal Activity/Legal Involvement Pertinent to Current Situation/Hospitalization: No - Comment as needed  Activities of Daily Living Home Assistive  Devices/Equipment: Grab bars in shower ADL Screening (condition at time of admission) Patient's cognitive ability adequate to safely complete daily activities?: Yes Is the patient deaf or have difficulty hearing?: No Does the patient have difficulty seeing, even when wearing glasses/contacts?: No Does the patient have difficulty concentrating, remembering, or making decisions?: No Patient able to express need for assistance with ADLs?: Yes Does the patient have difficulty dressing or bathing?: No Independently performs ADLs?: Yes (appropriate for developmental age) Does the patient have difficulty walking or climbing stairs?: No Weakness of Legs: None Weakness of Arms/Hands: None  Permission Sought/Granted Permission sought to share information with : Case Manager Permission granted to share information with : Yes, Verbal Permission Granted     Permission granted to share info w AGENCY: [place] granted to share info w Relationship: Spouse     Emotional Assessment Appearance:: Appears stated age Attitude/Demeanor/Rapport: Engaged Affect (typically observed): Accepting Orientation: : Oriented to Self, Oriented to Place, Oriented to  Time, Oriented to Situation Alcohol / Substance Use: Not Applicable Psych Involvement: No (comment)  Admission diagnosis:  LEFT KNEE OSTEOARTHRITIS Patient Active Problem List   Diagnosis Date Noted  . S/P total knee arthroplasty, left 12/09/2018  . Degenerative arthritis of left knee 12/05/2018  . 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 S/P reverse total shoulder arthroplasty, right 01/18/2017   PCP:  Javier Glazier, MD Pharmacy:   [facility] [place], [address]  [place] [address] Phone: [phone] Fax: [phone]     Social Determinants of Health (SDOH) Interventions    Readmission Risk Interventions No flowsheet data  found.

## 2018-12-10 NOTE — Progress Notes (Signed)
PATIENT ID: Candice Nolan  MRN: [id number]  DOB/AGE:  1940-10-26 / 78 y.o.  1 Day Post-Op Procedure(s) (LRB): LEFT TOTAL KNEE ARTHROPLASTY (Left)    PROGRESS NOTE Subjective: Patient is alert, oriented, no Nausea, no Vomiting, yes passing gas. Taking PO well. Denies SOB, Chest or Calf Pain. Using Incentive Spirometer, PAS in place. Ambulate 30', Patient reports pain as 2/10 .    Objective: Vital signs in last 24 hours: Vitals:   12/09/18 1612 12/09/18 2030 12/10/18 0105 12/10/18 0446  BP: (Abnormal) 155/59 (Abnormal) 146/52 (Abnormal) 159/67 (Abnormal) 131/56  Pulse: 87 81 85 74  Resp: 18 16 16 16   Temp: 98.4 F (36.9 C) 97.8 F (36.6 C) 98.7 F (37.1 C) 98.1 F (36.7 C)  TempSrc: Oral Oral Oral Oral  SpO2: 97% 99% 97% 98%  Weight:      Height:          Intake/Output from previous day: I/O last 3 completed shifts: In: 4455.3 [P.O.:600; I.V.:3555.3; IV Piggyback:300] Out: 5300 [Urine:5200; Blood:100]   Intake/Output this shift: No intake/output data recorded.   LABORATORY DATA: Recent Labs    12/09/18 0750 12/10/18 0425  WBC  --  12.9*  HGB  --  9.4*  HCT  --  30.0*  PLT  --  221  NA  --  138  K  --  4.4  CL  --  106  CO2  --  25  BUN  --  14  CREATININE  --  0.58  GLUCOSE  --  151*  INR 0.8  --   CALCIUM  --  8.6*    Examination: Neurologically intact ABD soft Neurovascular intact Sensation intact distally Intact pulses distally Dorsiflexion/Plantar flexion intact Incision: dressing C/D/I No cellulitis present Compartment soft} Patient able to do straight leg raise.  History of prior urinary incontinence and she does self cath  Assessment:   1 Day Post-Op Procedure(s) (LRB): LEFT TOTAL KNEE ARTHROPLASTY (Left) ADDITIONAL DIAGNOSIS: Expected Acute Blood Loss Anemia, urinary incontinance self cath  Patient's anticipated LOS is less than 2 midnights, meeting these requirements: - Younger than 3 - Lives within 1 hour of care - Has a  competent adult at home to recover with post-op recover - NO history of  - Chronic pain requiring opiods  - Diabetes  - Coronary Artery Disease  - Heart failure  - Heart attack  - Stroke  - DVT/VTE  - Cardiac arrhythmia  - Respiratory Failure/COPD  - Renal failure  - Anemia  - Advanced Liver disease       Plan: PT/OT WBAT, AROM and PROM  DVT Prophylaxis:  SCDx72hrs, ASA 81 mg BID x 2 weeks DISCHARGE PLAN: Skilled Nursing Facility/Rehab,Patient is working on a short stay at [place] and hopes to hear back from them today.  She should be able to go to the skilled nursing center as soon is a bed is ready. DISCHARGE NEEDS: HHPT, Walker and 3-in-1 comode seat     Kerin Salen 12/10/2018, 7:25 AM

## 2018-12-10 NOTE — Plan of Care (Signed)

## 2018-12-10 NOTE — TOC Progression Note (Signed)
Transition of Care [facility]) - Progression Note    Patient Details  Name: Candice Nolan MRN: [id number] Date of Birth: 15-Jul-1940  Transition of [facility]) CM/SW [place], [place] Phone Number: 12/10/2018, 1:49 PM  Clinical Narrative:    Authorization received [phone]. 3 days starting 12/8 and next review 12/10.  Patient last COVID-19 test was 12/3, new covid-19 test pending discharge.  CSW updated the patient at bedside.   Facility will accept the patient when COVID-19 test is received. Room 7013, call [phone] to give report.    Expected Discharge Plan: Skilled Nursing Facility Barriers to Discharge: Other (comment)(COVID-19 test)  Expected Discharge Plan and Services Expected Discharge Plan: [place] In-house Referral: Clinical Social Work Discharge Planning Services: CM Consult Post Acute Care Choice: [place] arrangements for the past 2 months: [place]                                       Social Determinants of Health (SDOH) Interventions    Readmission Risk Interventions No flowsheet data found.

## 2018-12-10 NOTE — Plan of Care (Signed)
  Problem: Education: Goal: Knowledge of General Education information will improve Description: Including pain rating scale, medication(s)/side effects and non-pharmacologic comfort measures Outcome: Progressing   Problem: Clinical Measurements: Goal: Respiratory complications will improve Outcome: Progressing Goal: Cardiovascular complication will be avoided Outcome: Progressing   Problem: Activity: Goal: Risk for activity intolerance will decrease Outcome: Progressing   Problem: Coping: Goal: Level of anxiety will decrease Outcome: Progressing   Problem: Elimination: Goal: Will not experience complications related to urinary retention Outcome: Progressing   Problem: Pain Managment: Goal: General experience of comfort will improve Outcome: Progressing   Problem: Safety: Goal: Ability to remain free from injury will improve Outcome: Progressing   Problem: Education: Goal: Knowledge of the prescribed therapeutic regimen will improve Outcome: Progressing   Problem: Activity: Goal: Ability to avoid complications of mobility impairment will improve Outcome: Progressing

## 2018-12-10 NOTE — Progress Notes (Signed)
Physical Therapy Treatment Patient Details Name: Candice Nolan MRN: [id number] DOB: Jul 23, 1940 Today's Date: 12/10/2018    History of Present Illness Pt is 78 yo female s/p R TKA on 12/09/18.  Pt with hx of bil total shoulder replacement ( R Reverse TSA 3 months ago).  See history and physical for full PMH.    PT Comments    Pt making excellent progress for POD #1.  She demonstrates 0 to 90 degrees R knee ROM and good quad activation.  Pt ambulated 100' with min guard assist.  Needs cues for safety and to take her time.  Pt is very determined to do all exercises and activities exactly as prescribed for her recovery.      Follow Up Recommendations  Follow surgeon's recommendation for DC plan and follow-up therapies     Equipment Recommendations  Other (comment)(youth RW if not going to rehab)    Recommendations for Other Services       Precautions / Restrictions Precautions Precautions: Fall Restrictions Weight Bearing Restrictions: Yes LLE Weight Bearing: Weight bearing as tolerated    Mobility  Bed Mobility Overal bed mobility: Needs Assistance Bed Mobility: Supine to Sit     Supine to sit: Supervision;HOB elevated     General bed mobility comments: used gait belt as leg lifter to assist  Transfers Overall transfer level: Needs assistance Equipment used: Rolling walker (2 wheeled) Transfers: Sit to/from Stand Sit to Stand: Min guard         General transfer comment: performed x 2; cues for safe hand placement  Ambulation/Gait Ambulation/Gait assistance: Min guard Gait Distance (Feet): 100 Feet Assistive device: Rolling walker (2 wheeled) Gait Pattern/deviations: Step-to pattern;Decreased stride length;Decreased weight shift to right Gait velocity: decreased   General Gait Details: cue for RW distance and sequence   Stairs             Wheelchair Mobility    Modified Rankin (Stroke Patients Only)       Balance Overall balance  assessment: Needs assistance Sitting-balance support: Bilateral upper extremity supported;Feet supported Sitting balance-Leahy Scale: Normal     Standing balance support: Bilateral upper extremity supported;During functional activity Standing balance-Leahy Scale: Fair                              Cognition Arousal/Alertness: Awake/alert Behavior During Therapy: WFL for tasks assessed/performed Overall Cognitive Status: Within Functional Limits for tasks assessed                                        Exercises Total Joint Exercises Ankle Circles/Pumps: AROM;Both;10 reps;Supine Quad Sets: AROM;Right;10 reps;Supine Towel Squeeze: AROM;Both;10 reps;Supine Short Arc Quad: AROM;Left;10 reps;Supine Heel Slides: Supine;Left;10 reps;AAROM Hip ABduction/ADduction: AROM;Supine;Left;10 reps Straight Leg Raises: AAROM;Supine;Left;10 reps Knee Flexion: AROM;Left;10 reps Goniometric ROM: R knee ROM 0- 90    General Comments        Pertinent Vitals/Pain Pain Assessment: 0-10 Pain Score: 3  Pain Location: R knee Pain Descriptors / Indicators: Discomfort Pain Intervention(s): Ice applied;Limited activity within patient's tolerance;Premedicated before session    Home Living                      Prior Function            PT Goals (current goals can now be found in the care plan section) Progress  towards PT goals: Progressing toward goals    Frequency    7X/week      PT Plan Current plan remains appropriate    Co-evaluation              AM-PAC PT "6 Clicks" Mobility   Outcome Measure  Help needed turning from your back to your side while in a flat bed without using bedrails?: None Help needed moving from lying on your back to sitting on the side of a flat bed without using bedrails?: None Help needed moving to and from a bed to a chair (including a wheelchair)?: None Help needed standing up from a chair using your arms (e.g.,  wheelchair or bedside chair)?: None Help needed to walk in hospital room?: None Help needed climbing 3-5 steps with a railing? : A Little 6 Click Score: 23    End of Session Equipment Utilized During Treatment: Gait belt Activity Tolerance: Patient tolerated treatment well Patient left: in chair;with chair alarm set;with call bell/phone within reach Nurse Communication: Mobility status PT Visit Diagnosis: Other abnormalities of gait and mobility (R26.89);Muscle weakness (generalized) (M62.81)     Time: 1030-1104 PT Time Calculation (min) (ACUTE ONLY): 34 min  Charges:  $Gait Training: 8-22 mins $Therapeutic Exercise: 8-22 mins                     Maggie Font, PT Acute Rehab Services Pager [phone] [facility] [phone]    Karlton Lemon 12/10/2018, 11:15 AM

## 2018-12-10 NOTE — Anesthesia Postprocedure Evaluation (Signed)
Anesthesia Post Note  Patient: Candice Nolan  Procedure(s) Performed: LEFT TOTAL KNEE ARTHROPLASTY (Left Knee)     Patient location during evaluation: PACU Anesthesia Type: Spinal Level of consciousness: oriented and awake and alert Pain management: pain level controlled Vital Signs Assessment: post-procedure vital signs reviewed and stable Respiratory status: spontaneous breathing, respiratory function stable and patient connected to nasal cannula oxygen Cardiovascular status: blood pressure returned to baseline and stable Postop Assessment: no headache, no backache and no apparent nausea or vomiting Anesthetic complications: no    Last Vitals:  Vitals:   12/10/18 0446 12/10/18 0931  BP: (!) 131/56 (!) 172/66  Pulse: 74 90  Resp: 16 15  Temp: 36.7 C 37.1 C  SpO2: 98% 100%    Last Pain:  Vitals:   12/10/18 0931  TempSrc: Oral  PainSc:    Pain Goal: Patients Stated Pain Goal: 4 (12/10/18 0701)                 Eron Staat

## 2018-12-11 DIAGNOSIS — M1712 Unilateral primary osteoarthritis, left knee: Secondary | ICD-10-CM | POA: Diagnosis not present

## 2018-12-11 LAB — CBC
HCT: 30.3 % — ABNORMAL LOW (ref 36.0–46.0)
Hemoglobin: 9.7 g/dL — ABNORMAL LOW (ref 12.0–15.0)
MCH: 29.4 pg (ref 26.0–34.0)
MCHC: 32 g/dL (ref 30.0–36.0)
MCV: 91.8 fL (ref 80.0–100.0)
Platelets: 183 10*3/uL (ref 150–400)
RBC: 3.3 MIL/uL — ABNORMAL LOW (ref 3.87–5.11)
RDW: 14.1 % (ref 11.5–15.5)
WBC: 10.9 10*3/uL — ABNORMAL HIGH (ref 4.0–10.5)
nRBC: 0 % (ref 0.0–0.2)

## 2018-12-11 MED ORDER — TIZANIDINE HCL 2 MG PO TABS: 2.0000 mg | ORAL_TABLET | Freq: Four times a day (QID) | ORAL | 0 refills | Status: DC | PRN

## 2018-12-11 MED ORDER — OXYCODONE-ACETAMINOPHEN 5-325 MG PO TABS: 1.0000 | ORAL_TABLET | ORAL | 0 refills | Status: DC | PRN

## 2018-12-11 MED ORDER — ASPIRIN EC 81 MG PO TBEC: 81.0000 mg | DELAYED_RELEASE_TABLET | Freq: Two times a day (BID) | ORAL | 0 refills | Status: AC

## 2018-12-11 NOTE — Progress Notes (Signed)
PATIENT ID: Candice Nolan  MRN: [id number]  DOB/AGE:  1940-07-28 / 78 y.o.  2 Days Post-Op Procedure(s) (LRB): LEFT TOTAL KNEE ARTHROPLASTY (Left)    PROGRESS NOTE Subjective: Patient is alert, oriented, no Nausea, no Vomiting, yes passing gas, . Taking PO well. Denies SOB, Chest or Calf Pain. Using Incentive Spirometer, PAS in place. Ambulate WBAT with pt walking 20 ft with therapy Patient reports pain as  Mild at rest and moderate with activity.   Objective: Vital signs in last 24 hours: Vitals:   12/10/18 1445 12/10/18 1748 12/10/18 2112 12/11/18 0520  BP: (!) 168/70 (!) 141/62 (!) 147/62 (!) 125/52  Pulse: 90 87 94 84  Resp: 16 16 19 18   Temp: 98.3 F (36.8 C) 98.4 F (36.9 C) 98.6 F (37 C) 98.8 F (37.1 C)  TempSrc: Oral  Oral Oral  SpO2: 100% 99% 99% 97%  Weight:      Height:          Intake/Output from previous day: I/O last 3 completed shifts: In: 3005.3 [P.O.:1320; I.V.:1685.3] Out: 6700 [Urine:6700]   Intake/Output this shift: No intake/output data recorded.   LABORATORY DATA: Recent Labs    12/09/18 0750 12/10/18 0425 12/11/18 0259  WBC  --  12.9* 10.9*  HGB  --  9.4* 9.7*  HCT  --  30.0* 30.3*  PLT  --  221 183  NA  --  138  --   K  --  4.4  --   CL  --  106  --   CO2  --  25  --   BUN  --  14  --   CREATININE  --  0.58  --   GLUCOSE  --  151*  --   INR 0.8  --   --   CALCIUM  --  8.6*  --     Examination: Neurologically intact Neurovascular intact Sensation intact distally Intact pulses distally Dorsiflexion/Plantar flexion intact Incision: dressing C/D/I No cellulitis present Compartment soft} XR AP&Lat of hip shows well placed\fixed THA  Assessment:   2 Days Post-Op Procedure(s) (LRB): LEFT TOTAL KNEE ARTHROPLASTY (Left) ADDITIONAL DIAGNOSIS:  Expected Acute Blood Loss Anemia, urinary incontinance self cath  Patient's anticipated LOS is less than 2 midnights, meeting these requirements: - Younger than 29 - Lives within 1  hour of care - Has a competent adult at home to recover with post-op recover - NO history of  - Chronic pain requiring opiods  - Diabetes  - Coronary Artery Disease  - Heart failure  - Heart attack  - Stroke  - DVT/VTE  - Cardiac arrhythmia  - Respiratory Failure/COPD  - Renal failure  - Anemia  - Advanced Liver disease       Plan: PT/OT WBAT, THA  DVT Prophylaxis: SCDx72 hrs, ASA 81 mg BID x 2 weeks  DISCHARGE PLAN: Skilled Nursing Facility/Rehab  DISCHARGE NEEDS: HHPT, Walker and 3-in-1 comode seat

## 2018-12-11 NOTE — TOC Transition Note (Addendum)
Transition of Care [facility]) - CM/SW Discharge Note   Patient Details  Name: Candice Nolan MRN: [id number] Date of Birth: 1940/09/27  Transition of Care [facility]) CM/SW Contact:  Lia Hopping, [place] Phone Number: 12/11/2018, 12:57 PM   Clinical Narrative:    COVID-19 test negative. Facility ready to accept the patient. Spouse to transport. Scripts signed and to sent with the patient in discharge packet. The nurse given the number to call report.   Final next level of care: Skilled Nursing Facility Barriers to Discharge: Other (comment)(COVID-19 test)   Patient Goals and CMS Choice Patient states their goals for this hospitalization and ongoing recovery are:: "rehab at [facility].gov Compare Post Acute Care list provided to:: Patient Choice offered to / list presented to : NA  Discharge Placement PASRR number recieved: 12/10/18            Patient chooses bed at: [facility] Patient to be transferred to facility by: Spouse will transport Name of family member notified: Patient to notify her spouse Patient and family notified of of transfer: 12/11/18  Discharge Plan and Services In-house Referral: Clinical Social Work Discharge Planning Services: [employer] Consult Post Acute Care Choice: [place]                               Social Determinants of Health (SDOH) Interventions     Readmission Risk Interventions No flowsheet data found.

## 2018-12-11 NOTE — Progress Notes (Signed)
Physical Therapy Treatment Patient Details Name: Candice Nolan MRN: [id number] DOB: 05-07-1940 Today's Date: 12/11/2018    History of Present Illness Pt is 78 yo female s/p R TKA on 12/09/18.  Pt with hx of bil total shoulder replacement ( R Reverse TSA 3 months ago).  See history and physical for full PMH.    PT Comments    Pt is progressing with PT.  She needs cues for safety and to focus on task at hand, as she has tendency to attempt to multi-task or with anxious behavior.  Ambulated 100' with RW and min guard.     Follow Up Recommendations  Follow surgeon's recommendation for DC plan and follow-up therapies     Equipment Recommendations  None recommended by PT    Recommendations for Other Services       Precautions / Restrictions Precautions Precautions: Fall Restrictions Weight Bearing Restrictions: No LLE Weight Bearing: Weight bearing as tolerated    Mobility  Bed Mobility Overal bed mobility: Needs Assistance Bed Mobility: Supine to Sit     Supine to sit: Supervision;HOB elevated     General bed mobility comments: used gait belt as leg lifter to assist  Transfers Overall transfer level: Needs assistance Equipment used: Rolling walker (2 wheeled) Transfers: Sit to/from Stand Sit to Stand: Min guard         General transfer comment: performed x 2; cues for safe hand placement; toielting ADLs with min A for balance and cues for safe hand placement when standing  Ambulation/Gait Ambulation/Gait assistance: Min guard Gait Distance (Feet): 100 Feet Assistive device: Rolling walker (2 wheeled) Gait Pattern/deviations: Step-to pattern;Decreased stride length;Decreased weight shift to right Gait velocity: decreased   General Gait Details: cue for RW distance and sequence; cues to focus on walking for safety   Stairs             Wheelchair Mobility    Modified Rankin (Stroke Patients Only)       Balance Overall balance assessment: Needs  assistance Sitting-balance support: Bilateral upper extremity supported;Feet supported Sitting balance-Leahy Scale: Normal     Standing balance support: Bilateral upper extremity supported;During functional activity Standing balance-Leahy Scale: Fair                              Cognition Arousal/Alertness: Awake/alert Behavior During Therapy: WFL for tasks assessed/performed Overall Cognitive Status: Within Functional Limits for tasks assessed                                 General Comments: Pt with tendency for anxiousness at times and needs cues to focus on task at hand      Exercises Total Joint Exercises Ankle Circles/Pumps: AROM;Both;10 reps;Supine Quad Sets: AROM;Right;10 reps;Supine Short Arc Quad: AROM;Left;10 reps;Supine Knee Flexion: AROM;Left;10 reps Goniometric ROM: R knee ROM 0 to 80    General Comments  Pt with questions involving her home set up - discussed that she is moving well and this will likely be looked at by rehab/SNF or HHPT staff since she is a resident of [facility].      Pertinent Vitals/Pain Pain Score: 4  Pain Location: R knee Pain Descriptors / Indicators: Discomfort Pain Intervention(s): Ice applied;Limited activity within patient's tolerance;Repositioned    Home Living  Prior Function            PT Goals (current goals can now be found in the care plan section) Progress towards PT goals: Progressing toward goals    Frequency    7X/week      PT Plan Current plan remains appropriate    Co-evaluation              AM-PAC PT "6 Clicks" Mobility   Outcome Measure  Help needed turning from your back to your side while in a flat bed without using bedrails?: None Help needed moving from lying on your back to sitting on the side of a flat bed without using bedrails?: None Help needed moving to and from a bed to a chair (including a wheelchair)?: None Help needed standing  up from a chair using your arms (e.g., wheelchair or bedside chair)?: None Help needed to walk in hospital room?: None Help needed climbing 3-5 steps with a railing? : A Little 6 Click Score: 23    End of Session Equipment Utilized During Treatment: Gait belt Activity Tolerance: Patient tolerated treatment well Patient left: with call bell/phone within reach;in bed;with bed alarm set Nurse Communication: Mobility status PT Visit Diagnosis: Other abnormalities of gait and mobility (R26.89);Muscle weakness (generalized) (M62.81)     Time: [id number] PT Time Calculation (min) (ACUTE ONLY): 24 min  Charges:  $Gait Training: 8-22 mins $Therapeutic Exercise: 8-22 mins                     Maggie Font, PT Acute Rehab Services Pager [phone] [facility] [phone] [facility] [phone]    Karlton Lemon 12/11/2018, 11:37 AM

## 2018-12-11 NOTE — Progress Notes (Signed)
Patient discharged to [facility]. Report called to [facility] at facility. All belongings w/ patient and husband. Escorted to pov via w/c.

## 2018-12-11 NOTE — Discharge Summary (Signed)
Patient ID: Candice Nolan MRN: [id number] DOB/AGE: February 17, 1940 78 y.o.  Admit date: 12/09/2018 Discharge date: 12/11/2018  Admission Diagnoses:  Principal Problem:   Degenerative arthritis of left knee Active Problems:   S/P total knee arthroplasty, left   Discharge Diagnoses:  Same  Past Medical History:  Diagnosis Date  . Anxiety   . Arthritis   . 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 Cancer ([place])    basal cell skin cancer removed  . Complication of anesthesia    elevated temp post-op '70, no problems since. Received Suprane and sccinylcholine 03/15/09 w/o issue.   . Depression   . GERD (gastroesophageal reflux disease)   . H/O bladder infections   . Incontinence    at young age , and bowel as a young adult . Had bladder reconstruction and part of colon was used as top of bladder . Pt. self caths 4 x a day  . Macular degeneration   . Pneumonia    as a child  . Sleep related leg cramps    and in water during exercies    Surgeries: Procedure(s): LEFT TOTAL KNEE ARTHROPLASTY on 12/09/2018   Consultants:   Discharged Condition: Improved  Hospital Course: Candice Nolan is an 78 y.o. female who was admitted 12/09/2018 for operative treatment ofDegenerative arthritis of left knee. Patient has severe unremitting pain that affects sleep, daily activities, and work/hobbies. After pre-op clearance the patient was taken to the operating room on 12/09/2018 and underwent  Procedure(s): LEFT TOTAL KNEE ARTHROPLASTY.    Patient was given perioperative antibiotics:  Anti-infectives (From admission, onward)   Start     Dose/Rate Route Frequency Ordered Stop   12/09/18 0730  ceFAZolin (ANCEF) IVPB 2g/100 mL premix     2 g 200 mL/hr over 30 Minutes Intravenous On call to O.R. 12/09/18 0725 12/09/18 1000       Patient was given sequential compression devices, early ambulation, and chemoprophylaxis to prevent DVT.  Patient benefited maximally from  hospital stay and there were no complications.    Recent vital signs:  Patient Vitals for the past 24 hrs:  BP Temp Temp src Pulse Resp SpO2  12/11/18 0520 (!) 125/52 98.8 F (37.1 C) Oral 84 18 97 %  12/10/18 2112 (!) 147/62 98.6 F (37 C) Oral 94 19 99 %  12/10/18 1748 (!) 141/62 98.4 F (36.9 C) - 87 16 99 %  12/10/18 1445 (!) 168/70 98.3 F (36.8 C) Oral 90 16 100 %  12/10/18 0931 (!) 172/66 98.8 F (37.1 C) Oral 90 15 100 %     Recent laboratory studies:  Recent Labs    12/09/18 0750 12/10/18 0425 12/11/18 0259  WBC  --  12.9* 10.9*  HGB  --  9.4* 9.7*  HCT  --  30.0* 30.3*  PLT  --  221 183  NA  --  138  --   K  --  4.4  --   CL  --  106  --   CO2  --  25  --   BUN  --  14  --   CREATININE  --  0.58  --   GLUCOSE  --  151*  --   INR 0.8  --   --   CALCIUM  --  8.6*  --      Discharge Medications:   Allergies as of 12/11/2018      Reactions   Ascorbate Swelling   Facial ASORBIC  ACID - can not have in pill form - foods are ok   Contrast Media [iodinated Diagnostic Agents] Shortness Of Breath   Iodine Shortness Of Breath   Chloramphenicol    UNSPECIFIED REACTION    Pentazocine Anxiety   Talwin    Tyloxapol Anxiety      Medication List    STOP taking these medications   acetaminophen 650 MG CR tablet Commonly known as: TYLENOL   HYDROcodone-acetaminophen 5-325 MG tablet Commonly known as: NORCO/VICODIN   Mobic 15 MG tablet Generic drug: meloxicam     TAKE these medications   Align 4 MG Caps Take 4 mg by mouth every evening.   ALPRAZolam 0.25 MG tablet Commonly known as: XANAX Take 0.125 mg by mouth See admin instructions. take 1/2 tab 30 minutes prior to eye injections every 8 weeks   aspirin EC 81 MG tablet Take 1 tablet (81 mg total) by mouth 2 (two) times daily. What changed: when to take this   CALCIUM-VITAMIN D PO Take 1 tablet by mouth every evening.   CRANBERRY PO Take 650 mg by mouth daily.   desvenlafaxine 50 MG 24 hr  tablet Commonly known as: PRISTIQ Take 50 mg by mouth daily. Take with 50mg  tablet=75 mg total daily dose   Desvenlafaxine Succinate ER 25 MG Tb24 Take 25 mg by mouth daily. Take with 50mg  tablet=75 mg total daily dose   EYLEA IO Inject into the eye See admin instructions. Every 8-10 weeks   methylphenidate 10 MG tablet Commonly known as: RITALIN Take 10 mg by mouth daily. Take with 20mg  tablet=30 mg total daily dose   methylphenidate 20 MG tablet Commonly known as: RITALIN Take 20 mg by mouth daily. Take with 10mg  tablet=30 mg total daily dose   multivitamin with minerals Tabs tablet Take 1 tablet by mouth daily.   omeprazole 20 MG capsule Commonly known as: PRILOSEC Take 20 mg by mouth daily.   oxyCODONE-acetaminophen 5-325 MG tablet Commonly known as: PERCOCET/ROXICET Take 1 tablet by mouth every 4 (four) hours as needed for severe pain.   PreserVision AREDS 2+Multi Vit Caps Take 1 tablet by mouth 2 (two) times daily.   Stay Awake Maximum Strength 200 MG Tabs tablet Generic drug: caffeine Take 100 mg by mouth every 4 (four) hours as needed (alertness).   tiZANidine 2 MG tablet Commonly known as: ZANAFLEX Take 1 tablet (2 mg total) by mouth every 6 (six) hours as needed.            Durable Medical Equipment  (From admission, onward)         Start     Ordered   12/09/18 1351  DME Walker rolling  Once    Question:  Patient needs a walker to treat with the following condition  Answer:  Status post total left knee replacement   12/09/18 1350   12/09/18 1351  DME 3 n 1  Once     12/09/18 1350           Discharge Care Instructions  (From admission, onward)         Start     Ordered   12/11/18 0000  Weight bearing as tolerated     12/11/18 0840          Diagnostic Studies: Dg Chest 2 View  Result Date: 12/07/2018 CLINICAL DATA:  [age]-year-old female with preop chest radiograph. EXAM: CHEST - 2 VIEW COMPARISON:  Chest radiograph dated  08/09/2018. FINDINGS: The lungs are clear. There is  no pleural effusion or pneumothorax. The cardiac silhouette is within normal limits. There is degenerative changes of the spine. No acute osseous pathology. Bilateral shoulder arthroplasties. IMPRESSION: No active cardiopulmonary disease. Electronically Signed   By: Anner Crete M.D.   On: 12/07/2018 16:10    Disposition: Discharge disposition: 03-Skilled Nursing Facility       Discharge Instructions    Call MD / Call 911   Complete by: As directed    If you experience chest pain or shortness of breath, CALL 911 and be transported to the hospital emergency room.  If you develope a fever above 101 F, pus (white drainage) or increased drainage or redness at the wound, or calf pain, call your surgeon's office.   Constipation Prevention   Complete by: As directed    Drink plenty of fluids.  Prune juice may be helpful.  You may use a stool softener, such as Colace (over the counter) 100 mg twice a day.  Use MiraLax (over the counter) for constipation as needed.   Diet - low sodium heart healthy   Complete by: As directed    Driving restrictions   Complete by: As directed    No driving for 2 weeks   Increase activity slowly as tolerated   Complete by: As directed    Patient may shower   Complete by: As directed    You may shower without a dressing once there is no drainage.  Do not wash over the wound.  If drainage remains, cover wound with plastic wrap and then shower.   Weight bearing as tolerated   Complete by: As directed        Contact information for follow-up providers    Frederik Pear, MD. Go on 12/24/2018.   Specialty: Orthopedic Surgery Why: Your appointment has been scheduled for 9:00 Contact information: [address] [place] [address] [phone]            Contact information for after-discharge care    Destination    [facility] .   Service: Skilled Nursing Contact  information: [address] [place] [phone]                   Signed: Joanell Rising 12/11/2018, 8:41 AM

## 2019-12-03 IMAGING — CR CHEST - 2 VIEW
2 series · 2 of 2 positions shown · non-contrast
Comparison: 01/11/2017 radiographs

CLINICAL DATA: Preoperative chest radiograph prior to shoulder
surgery.

EXAM:
CHEST - 2 VIEW

[w chest pa]
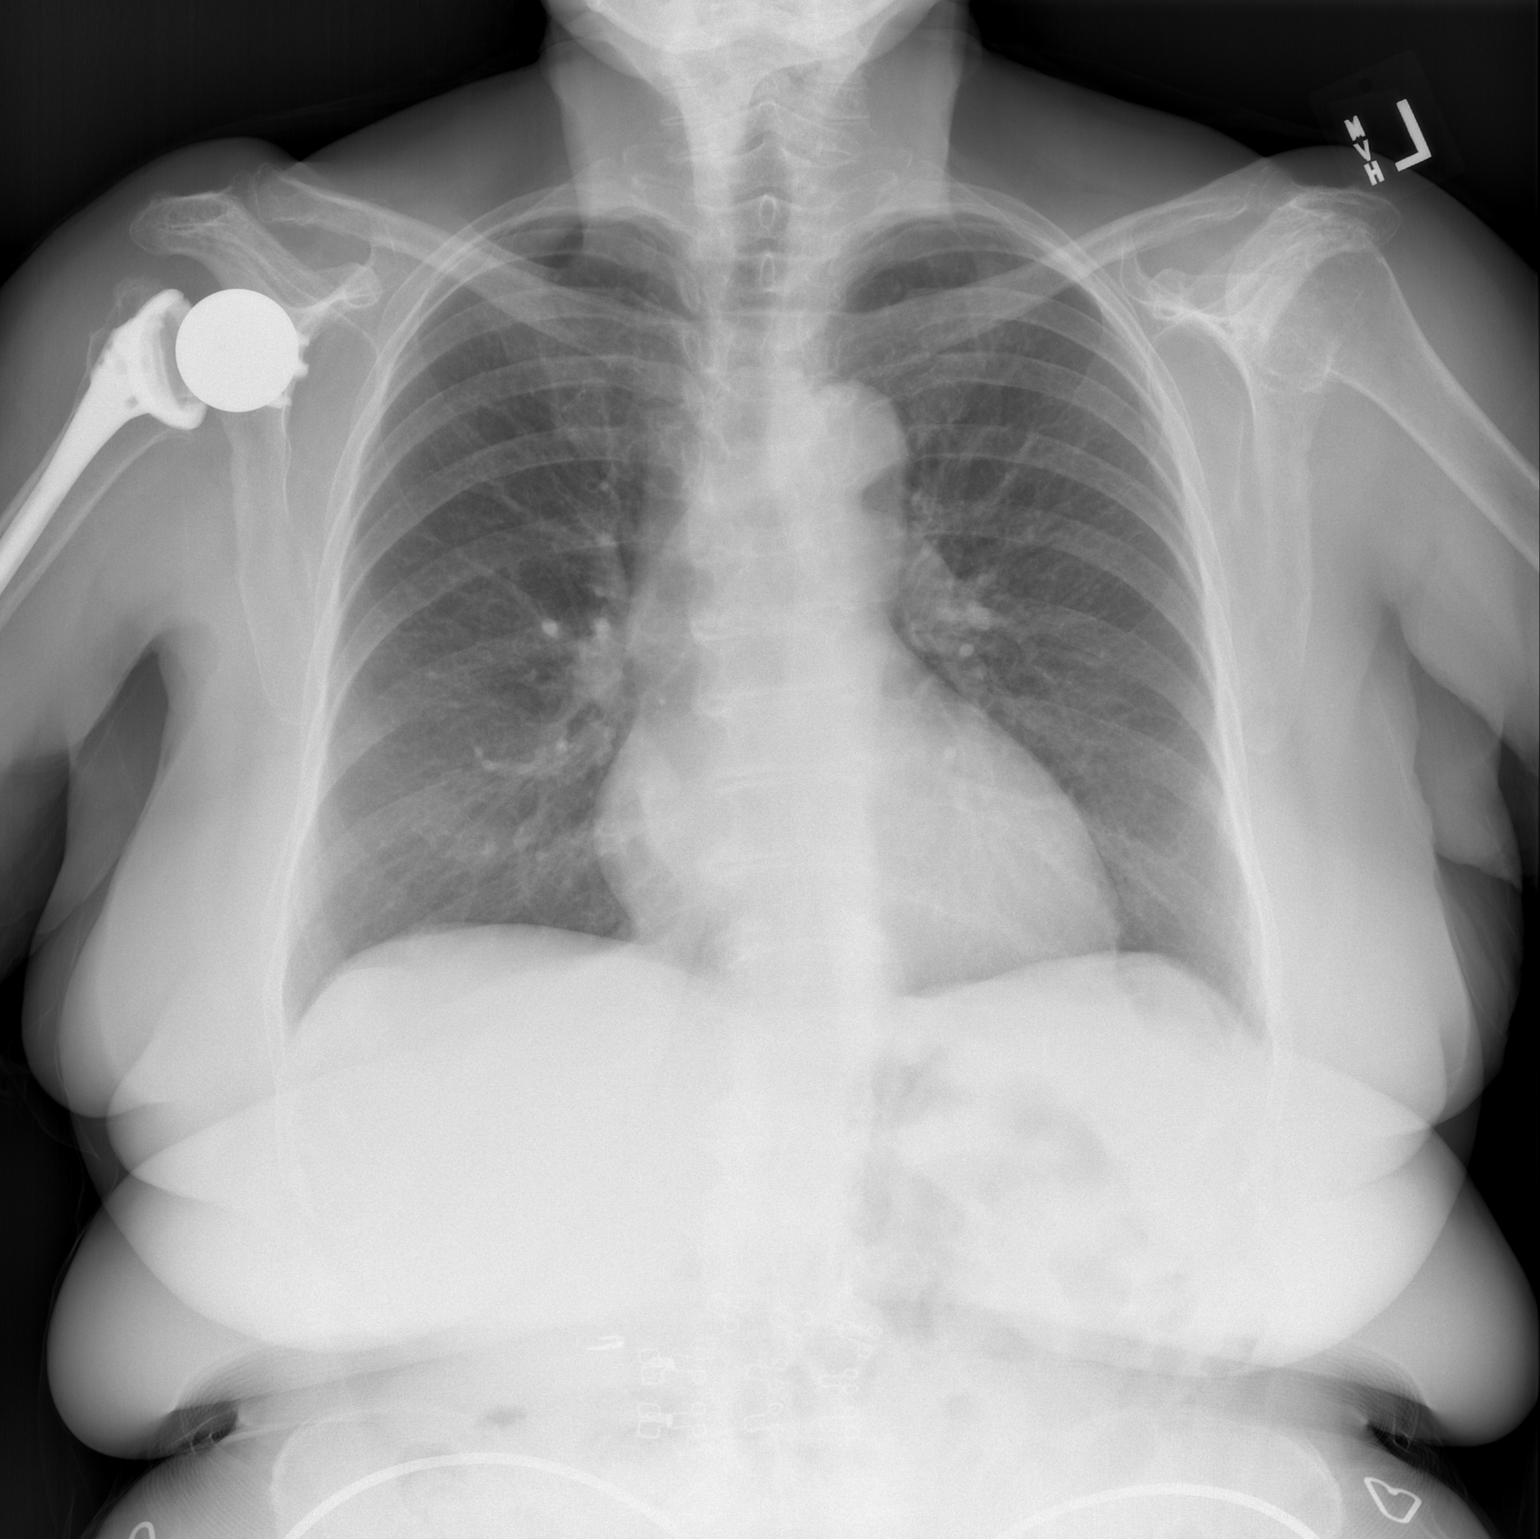

[w chest lat]
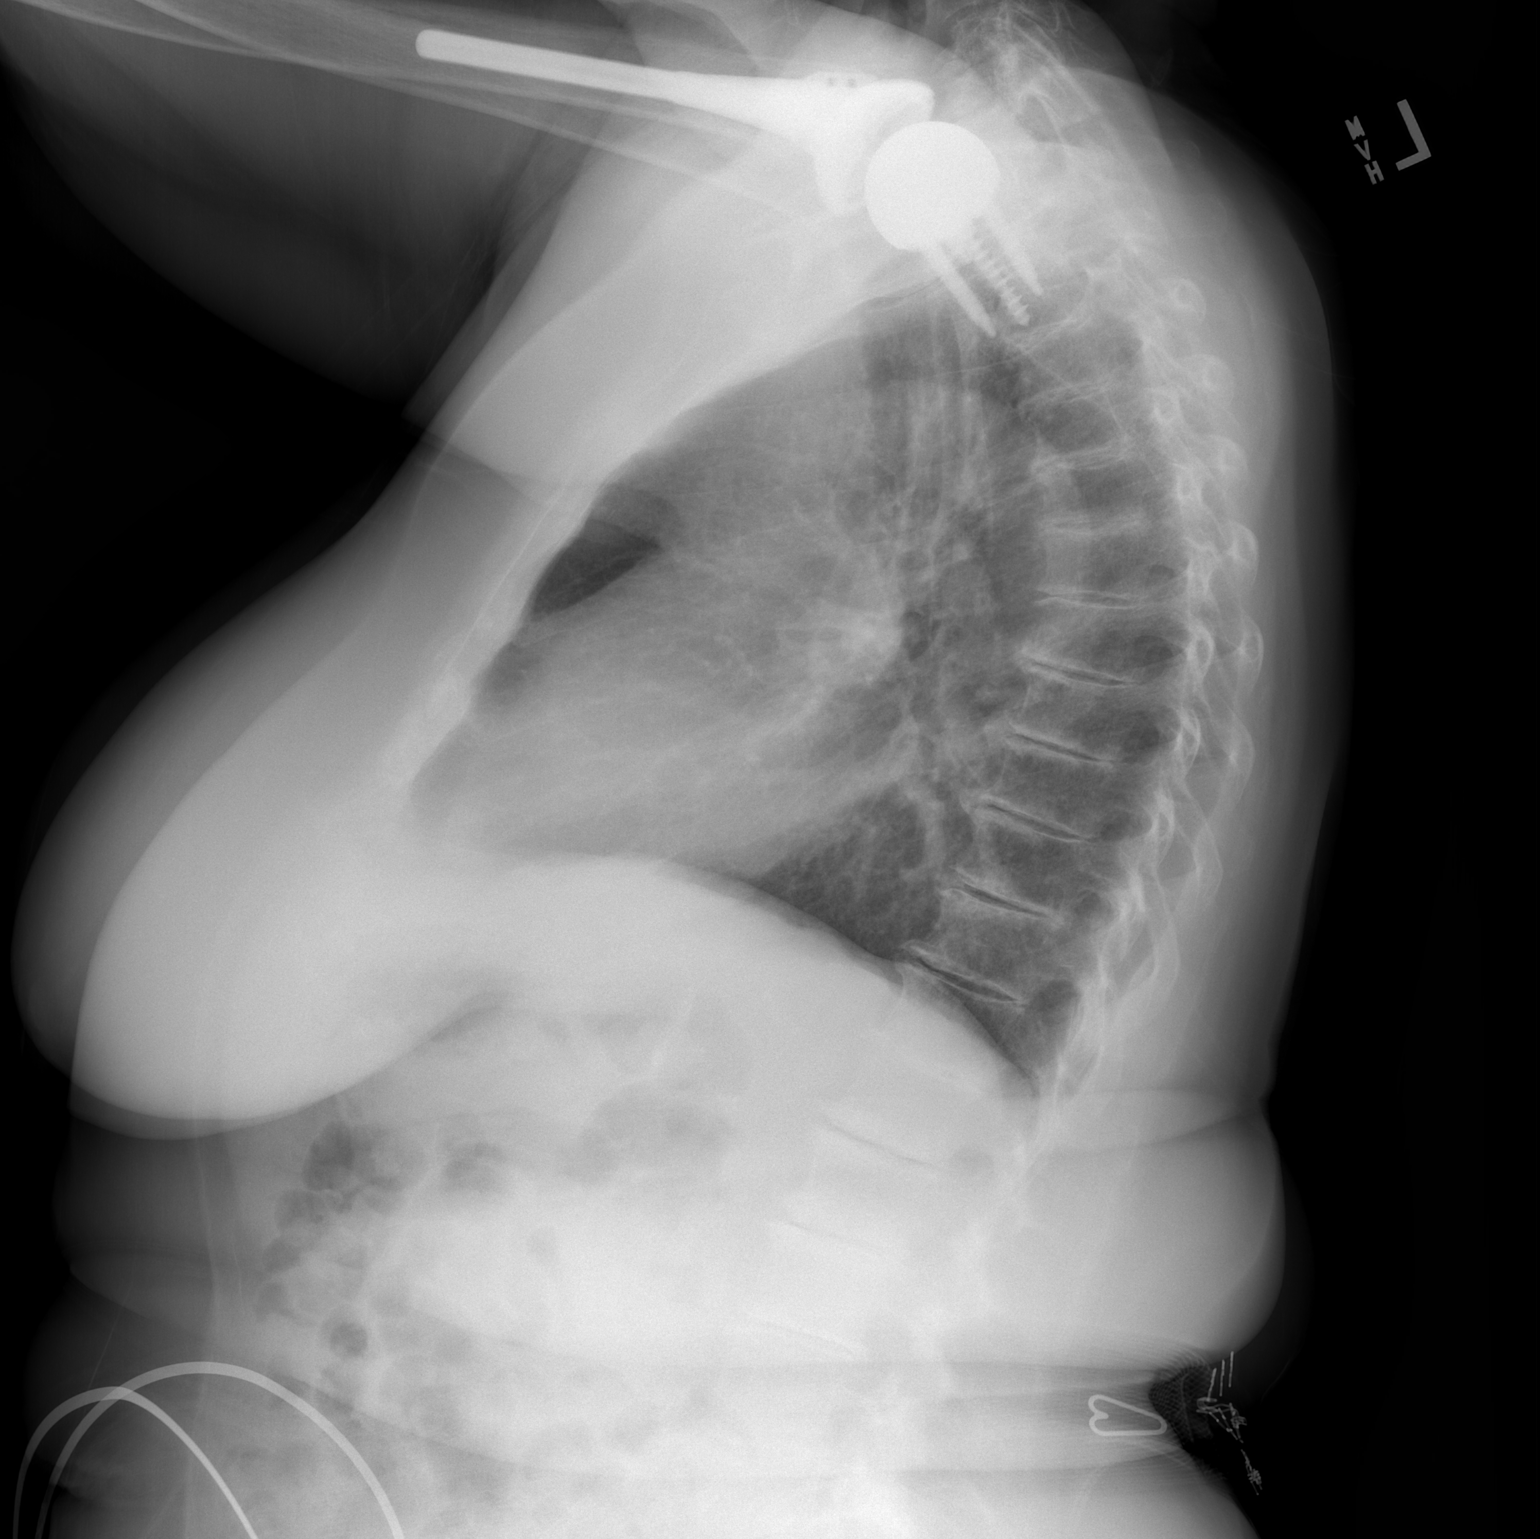

[2 of 2 positions shown; findings below may reference images not displayed]

FINDINGS: The cardiomediastinal silhouette is unremarkable.

There is no evidence of focal airspace disease, pulmonary edema,
suspicious pulmonary nodule/mass, pleural effusion, or pneumothorax.

No acute bony abnormalities are identified.

RIGHT shoulder arthroplasty and LEFT shoulder degenerative changes
noted.
IMPRESSION: No active cardiopulmonary disease.

## 2022-07-26 ENCOUNTER — Other Ambulatory Visit: Payer: Self-pay | Admitting: Orthopedic Surgery

## 2022-07-26 ENCOUNTER — Ambulatory Visit
Admission: RE | Admit: 2022-07-26 | Discharge: 2022-07-26 | Disposition: A | Discharge status: Home / Self Care | Payer: Medicare Other | Source: Ambulatory Visit | Attending: Orthopedic Surgery | Admitting: Orthopedic Surgery

## 2022-07-26 DIAGNOSIS — M25512 Pain in left shoulder: Secondary | ICD-10-CM

## 2022-07-27 ENCOUNTER — Encounter (HOSPITAL_COMMUNITY): Payer: Self-pay | Admitting: Orthopedic Surgery

## 2022-07-27 ENCOUNTER — Ambulatory Visit (HOSPITAL_COMMUNITY): Payer: Medicare Other

## 2022-07-27 ENCOUNTER — Other Ambulatory Visit: Payer: Self-pay

## 2022-07-27 ENCOUNTER — Ambulatory Visit (HOSPITAL_COMMUNITY): Payer: Medicare Other | Admitting: Certified Registered"

## 2022-07-27 ENCOUNTER — Ambulatory Visit (HOSPITAL_BASED_OUTPATIENT_CLINIC_OR_DEPARTMENT_OTHER): Payer: Medicare Other | Admitting: Certified Registered"

## 2022-07-27 ENCOUNTER — Encounter (HOSPITAL_COMMUNITY)
Admission: AD | Disposition: A | Discharge status: Skilled Nursing Facility | Payer: Self-pay | Source: Ambulatory Visit | Attending: Orthopedic Surgery

## 2022-07-27 ENCOUNTER — Observation Stay (HOSPITAL_COMMUNITY)
Admission: AD | Admit: 2022-07-27 | Discharge: 2022-07-31 | Disposition: A | Discharge status: Skilled Nursing Facility | Payer: Medicare Other | Source: Ambulatory Visit | Attending: Orthopedic Surgery | Admitting: Orthopedic Surgery

## 2022-07-27 DIAGNOSIS — M9732XA Periprosthetic fracture around internal prosthetic left shoulder joint, initial encounter: Secondary | ICD-10-CM

## 2022-07-27 DIAGNOSIS — Z8781 Personal history of (healed) traumatic fracture: Principal | ICD-10-CM

## 2022-07-27 DIAGNOSIS — W19XXXA Unspecified fall, initial encounter: Secondary | ICD-10-CM | POA: Insufficient documentation

## 2022-07-27 DIAGNOSIS — Z96612 Presence of left artificial shoulder joint: Secondary | ICD-10-CM | POA: Insufficient documentation

## 2022-07-27 DIAGNOSIS — Z96611 Presence of right artificial shoulder joint: Secondary | ICD-10-CM | POA: Insufficient documentation

## 2022-07-27 DIAGNOSIS — Z85828 Personal history of other malignant neoplasm of skin: Secondary | ICD-10-CM | POA: Diagnosis not present

## 2022-07-27 DIAGNOSIS — Z9889 Other specified postprocedural states: Principal | ICD-10-CM

## 2022-07-27 DIAGNOSIS — Z96652 Presence of left artificial knee joint: Secondary | ICD-10-CM | POA: Insufficient documentation

## 2022-07-27 DIAGNOSIS — S42292A Other displaced fracture of upper end of left humerus, initial encounter for closed fracture: Principal | ICD-10-CM | POA: Insufficient documentation

## 2022-07-27 HISTORY — PX: REVERSE SHOULDER ARTHROPLASTY: SHX5054

## 2022-07-27 LAB — CBC
HCT: 36.9 % (ref 36.0–46.0)
Hemoglobin: 11.4 g/dL — ABNORMAL LOW (ref 12.0–15.0)
MCH: 29.5 pg (ref 26.0–34.0)
MCHC: 30.9 g/dL (ref 30.0–36.0)
MCV: 95.6 fL (ref 80.0–100.0)
Platelets: 277 10*3/uL (ref 150–400)
RBC: 3.86 MIL/uL — ABNORMAL LOW (ref 3.87–5.11)
RDW: 13.6 % (ref 11.5–15.5)
WBC: 10 10*3/uL (ref 4.0–10.5)
nRBC: 0 % (ref 0.0–0.2)

## 2022-07-27 LAB — BASIC METABOLIC PANEL
Anion gap: 11 (ref 5–15)
BUN: 16 mg/dL (ref 8–23)
CO2: 24 mmol/L (ref 22–32)
Calcium: 9 mg/dL (ref 8.9–10.3)
Chloride: 102 mmol/L (ref 98–111)
Creatinine, Ser: 0.58 mg/dL (ref 0.44–1.00)
GFR, Estimated: >60 mL/min (ref >60)
Glucose, Bld: 88 mg/dL (ref 70–99)
Potassium: 4 mmol/L (ref 3.5–5.1)
Sodium: 137 mmol/L (ref 135–145)

## 2022-07-27 SURGERY — ARTHROPLASTY, SHOULDER, TOTAL, REVERSE
Anesthesia: General | Site: Shoulder

## 2022-07-27 MED ORDER — BUPIVACAINE HCL (PF) 0.5 % IJ SOLN
INTRAMUSCULAR | Status: DC | PRN
  Administered 2022-07-27: 15 mL via PERINEURAL

## 2022-07-27 MED ORDER — OXYCODONE HCL 5 MG/5ML PO SOLN: 5.0000 mg | Freq: Once | ORAL | Status: DC | PRN

## 2022-07-27 MED ORDER — CEFAZOLIN SODIUM-DEXTROSE 2-4 GM/100ML-% IV SOLN
2.0000 g | INTRAVENOUS | Status: AC
  Administered 2022-07-27: 2 g via INTRAVENOUS
  Filled 2022-07-27: qty 100

## 2022-07-27 MED ORDER — ONDANSETRON HCL 4 MG/2ML IJ SOLN: 4.0000 mg | Freq: Once | INTRAMUSCULAR | Status: DC | PRN

## 2022-07-27 MED ORDER — EPHEDRINE SULFATE-NACL 50-0.9 MG/10ML-% IV SOSY
PREFILLED_SYRINGE | INTRAVENOUS | Status: DC | PRN
  Administered 2022-07-27 (×3): 5 mg via INTRAVENOUS

## 2022-07-27 MED ORDER — 0.9 % SODIUM CHLORIDE (POUR BTL) OPTIME
TOPICAL | Status: DC | PRN
  Administered 2022-07-27: 1000 mL

## 2022-07-27 MED ORDER — ONDANSETRON HCL 4 MG/2ML IJ SOLN: 4.0000 mg | Freq: Four times a day (QID) | INTRAMUSCULAR | Status: DC | PRN

## 2022-07-27 MED ORDER — ARIPIPRAZOLE 2 MG PO TABS
2.0000 mg | ORAL_TABLET | Freq: Every day | ORAL | Status: DC
  Administered 2022-07-28 – 2022-07-31 (×4): 2 mg via ORAL
  Filled 2022-07-27 (×4): qty 1

## 2022-07-27 MED ORDER — PROPOFOL 10 MG/ML IV BOLUS
INTRAVENOUS | Status: AC
  Filled 2022-07-27: qty 20

## 2022-07-27 MED ORDER — PHENYLEPHRINE HCL (PRESSORS) 10 MG/ML IV SOLN
INTRAVENOUS | Status: AC
  Filled 2022-07-27: qty 1

## 2022-07-27 MED ORDER — METHOCARBAMOL 500 MG PO TABS
500.0000 mg | ORAL_TABLET | Freq: Four times a day (QID) | ORAL | Status: DC | PRN
  Administered 2022-07-27 – 2022-07-31 (×10): 500 mg via ORAL
  Filled 2022-07-27 (×10): qty 1

## 2022-07-27 MED ORDER — METOCLOPRAMIDE HCL 5 MG/ML IJ SOLN: 5.0000 mg | Freq: Three times a day (TID) | INTRAMUSCULAR | Status: DC | PRN

## 2022-07-27 MED ORDER — FENTANYL CITRATE (PF) 100 MCG/2ML IJ SOLN
INTRAMUSCULAR | Status: DC | PRN
  Administered 2022-07-27: 50 ug via INTRAVENOUS

## 2022-07-27 MED ORDER — BUPIVACAINE LIPOSOME 1.3 % IJ SUSP
INTRAMUSCULAR | Status: DC | PRN
  Administered 2022-07-27: 10 mL via PERINEURAL

## 2022-07-27 MED ORDER — BISACODYL 10 MG RE SUPP: 10.0000 mg | Freq: Every day | RECTAL | Status: DC | PRN

## 2022-07-27 MED ORDER — ONDANSETRON HCL 4 MG PO TABS: 4.0000 mg | ORAL_TABLET | Freq: Four times a day (QID) | ORAL | Status: DC | PRN

## 2022-07-27 MED ORDER — LACTATED RINGERS IV SOLN: INTRAVENOUS | Status: DC

## 2022-07-27 MED ORDER — ROCURONIUM BROMIDE 100 MG/10ML IV SOLN
INTRAVENOUS | Status: DC | PRN
  Administered 2022-07-27: 50 mg via INTRAVENOUS

## 2022-07-27 MED ORDER — PHENYLEPHRINE HCL-NACL 20-0.9 MG/250ML-% IV SOLN
INTRAVENOUS | Status: DC | PRN
  Administered 2022-07-27: 25 ug/min via INTRAVENOUS

## 2022-07-27 MED ORDER — MIDAZOLAM HCL 2 MG/2ML IJ SOLN: 0.5000 mg | INTRAMUSCULAR | Status: DC

## 2022-07-27 MED ORDER — AMLODIPINE BESYLATE 5 MG PO TABS
2.5000 mg | ORAL_TABLET | Freq: Every day | ORAL | Status: DC
  Administered 2022-07-29 – 2022-07-31 (×3): 2.5 mg via ORAL
  Filled 2022-07-27 (×3): qty 1

## 2022-07-27 MED ORDER — ACETAMINOPHEN 500 MG PO TABS
1000.0000 mg | ORAL_TABLET | Freq: Four times a day (QID) | ORAL | Status: AC
  Administered 2022-07-27 – 2022-07-28 (×3): 1000 mg via ORAL
  Filled 2022-07-27 (×3): qty 2

## 2022-07-27 MED ORDER — DIPHENHYDRAMINE HCL 12.5 MG/5ML PO ELIX: 12.5000 mg | ORAL_SOLUTION | ORAL | Status: DC | PRN

## 2022-07-27 MED ORDER — PHENYLEPHRINE 80 MCG/ML (10ML) SYRINGE FOR IV PUSH (FOR BLOOD PRESSURE SUPPORT)
PREFILLED_SYRINGE | INTRAVENOUS | Status: AC
  Filled 2022-07-27: qty 10

## 2022-07-27 MED ORDER — HYDROMORPHONE HCL 1 MG/ML IJ SOLN: 0.5000 mg | INTRAMUSCULAR | Status: DC | PRN

## 2022-07-27 MED ORDER — METHOCARBAMOL 1000 MG/10ML IJ SOLN: 500.0000 mg | Freq: Four times a day (QID) | INTRAVENOUS | Status: DC | PRN

## 2022-07-27 MED ORDER — CEFAZOLIN SODIUM-DEXTROSE 1-4 GM/50ML-% IV SOLN
1.0000 g | Freq: Four times a day (QID) | INTRAVENOUS | Status: AC
  Administered 2022-07-27 – 2022-07-28 (×3): 1 g via INTRAVENOUS
  Filled 2022-07-27 (×3): qty 50

## 2022-07-27 MED ORDER — ONDANSETRON HCL 4 MG/2ML IJ SOLN
INTRAMUSCULAR | Status: DC | PRN
  Administered 2022-07-27: 4 mg via INTRAVENOUS

## 2022-07-27 MED ORDER — PHENYLEPHRINE 80 MCG/ML (10ML) SYRINGE FOR IV PUSH (FOR BLOOD PRESSURE SUPPORT)
PREFILLED_SYRINGE | INTRAVENOUS | Status: DC | PRN
  Administered 2022-07-27: 160 ug via INTRAVENOUS

## 2022-07-27 MED ORDER — SUGAMMADEX SODIUM 200 MG/2ML IV SOLN
INTRAVENOUS | Status: DC | PRN
Start: 2022-07-27 — End: 2022-07-27
  Administered 2022-07-27: 200 mg via INTRAVENOUS

## 2022-07-27 MED ORDER — ASPIRIN 81 MG PO TBEC
81.0000 mg | DELAYED_RELEASE_TABLET | Freq: Every day | ORAL | Status: DC
  Administered 2022-07-28 – 2022-07-31 (×4): 81 mg via ORAL
  Filled 2022-07-27 (×4): qty 1

## 2022-07-27 MED ORDER — SODIUM CHLORIDE 0.9 % IV SOLN: INTRAVENOUS | Status: DC

## 2022-07-27 MED ORDER — DEXAMETHASONE SODIUM PHOSPHATE 10 MG/ML IJ SOLN
INTRAMUSCULAR | Status: DC | PRN
  Administered 2022-07-27: 10 mg via INTRAVENOUS

## 2022-07-27 MED ORDER — FENTANYL CITRATE (PF) 100 MCG/2ML IJ SOLN
INTRAMUSCULAR | Status: AC
  Filled 2022-07-27: qty 2

## 2022-07-27 MED ORDER — METOCLOPRAMIDE HCL 5 MG PO TABS: 5.0000 mg | ORAL_TABLET | Freq: Three times a day (TID) | ORAL | Status: DC | PRN

## 2022-07-27 MED ORDER — FENTANYL CITRATE PF 50 MCG/ML IJ SOSY
25.0000 ug | PREFILLED_SYRINGE | INTRAMUSCULAR | Status: AC
  Administered 2022-07-27: 50 ug via INTRAVENOUS
  Filled 2022-07-27: qty 2

## 2022-07-27 MED ORDER — PROPOFOL 10 MG/ML IV BOLUS
INTRAVENOUS | Status: DC | PRN
  Administered 2022-07-27: 25 ug/kg/min via INTRAVENOUS
  Administered 2022-07-27: 110 mg via INTRAVENOUS

## 2022-07-27 MED ORDER — OXYCODONE HCL 5 MG PO TABS: 10.0000 mg | ORAL_TABLET | ORAL | Status: DC | PRN

## 2022-07-27 MED ORDER — ACETAMINOPHEN 325 MG PO TABS
325.0000 mg | ORAL_TABLET | Freq: Four times a day (QID) | ORAL | Status: DC | PRN
  Administered 2022-07-28 – 2022-07-30 (×5): 650 mg via ORAL
  Filled 2022-07-27 (×6): qty 2

## 2022-07-27 MED ORDER — ACETAMINOPHEN 500 MG PO TABS
1000.0000 mg | ORAL_TABLET | Freq: Once | ORAL | Status: AC
  Administered 2022-07-27: 500 mg via ORAL
  Filled 2022-07-27: qty 2

## 2022-07-27 MED ORDER — DOCUSATE SODIUM 100 MG PO CAPS
100.0000 mg | ORAL_CAPSULE | Freq: Two times a day (BID) | ORAL | Status: DC
  Administered 2022-07-27 – 2022-07-31 (×8): 100 mg via ORAL
  Filled 2022-07-27 (×8): qty 1

## 2022-07-27 MED ORDER — OXYCODONE HCL 5 MG PO TABS: 5.0000 mg | ORAL_TABLET | Freq: Once | ORAL | Status: DC | PRN

## 2022-07-27 MED ORDER — FENTANYL CITRATE PF 50 MCG/ML IJ SOSY: 25.0000 ug | PREFILLED_SYRINGE | INTRAMUSCULAR | Status: DC | PRN

## 2022-07-27 MED ORDER — SENNOSIDES-DOCUSATE SODIUM 8.6-50 MG PO TABS: 1.0000 | ORAL_TABLET | Freq: Every evening | ORAL | Status: DC | PRN

## 2022-07-27 MED ORDER — VENLAFAXINE HCL ER 150 MG PO CP24
150.0000 mg | ORAL_CAPSULE | Freq: Every day | ORAL | Status: DC
  Administered 2022-07-28 – 2022-07-31 (×4): 150 mg via ORAL
  Filled 2022-07-27 (×4): qty 1

## 2022-07-27 MED ORDER — CHLORHEXIDINE GLUCONATE 0.12 % MT SOLN
15.0000 mL | Freq: Once | OROMUCOSAL | Status: AC
  Administered 2022-07-27: 15 mL via OROMUCOSAL

## 2022-07-27 MED ORDER — OXYCODONE HCL 5 MG PO TABS
5.0000 mg | ORAL_TABLET | ORAL | Status: DC | PRN
  Administered 2022-07-27 – 2022-07-31 (×9): 5 mg via ORAL
  Filled 2022-07-27 (×10): qty 1

## 2022-07-27 MED ORDER — LIDOCAINE 2% (20 MG/ML) 5 ML SYRINGE
INTRAMUSCULAR | Status: DC | PRN
  Administered 2022-07-27: 40 mg via INTRAVENOUS

## 2022-07-27 SURGICAL SUPPLY — 60 items
AID PSTN UNV HD RSTRNT DISP (MISCELLANEOUS) ×2
BAG COUNTER SPONGE SURGICOUNT (BAG) IMPLANT
BAG SPEC THK2 15X12 ZIP CLS (MISCELLANEOUS) ×2
BAG SPNG CNTER NS LX DISP (BAG)
BAG ZIPLOCK 12X15 (MISCELLANEOUS) ×3 IMPLANT
BODY PROXIMAL PTC 13X132.5 (Joint) ×1 IMPLANT
CAP LOCKING COCR (Cap) ×1 IMPLANT
COOLER ICEMAN CLASSIC (MISCELLANEOUS) ×3 IMPLANT
COVER SURGICAL LIGHT HANDLE (MISCELLANEOUS) ×3 IMPLANT
DRAPE C-ARM 42X120 X-RAY (DRAPES) ×3 IMPLANT
DRAPE INCISE IOBAN 66X45 STRL (DRAPES) ×2 IMPLANT
DRAPE ORTHO SPLIT 77X108 STRL (DRAPES) ×4
DRAPE POUCH INSTRU U-SHP 10X18 (DRAPES) ×3 IMPLANT
DRAPE SURG 17X11 SM STRL (DRAPES) ×3 IMPLANT
DRAPE SURG ORHT 6 SPLT 77X108 (DRAPES) ×6 IMPLANT
DRAPE TOP 10253 STERILE (DRAPES) ×1 IMPLANT
DRAPE U-SHAPE 47X51 STRL (DRAPES) ×3 IMPLANT
DRESSING MEPILEX FLEX 4X4 (GAUZE/BANDAGES/DRESSINGS) ×6 IMPLANT
DRSG MEPILEX FLEX 4X4 (GAUZE/BANDAGES/DRESSINGS)
DRSG MEPILEX POST OP 4X8 (GAUZE/BANDAGES/DRESSINGS) ×2 IMPLANT
DURAPREP 26ML APPLICATOR (WOUND CARE) ×2 IMPLANT
ELECT BLADE TIP CTD 4 INCH (ELECTRODE) ×3 IMPLANT
ELECT REM PT RETURN 15FT ADLT (MISCELLANEOUS) ×3 IMPLANT
GAUZE SPONGE 2X2 8PLY STRL LF (GAUZE/BANDAGES/DRESSINGS) IMPLANT
GAUZE SPONGE 4X4 12PLY STRL (GAUZE/BANDAGES/DRESSINGS) ×1 IMPLANT
GLOVE BIO SURGEON STRL SZ7 (GLOVE) ×3 IMPLANT
GLOVE BIO SURGEON STRL SZ7.5 (GLOVE) ×3 IMPLANT
GLOVE BIOGEL PI IND STRL 6.5 (GLOVE) ×3 IMPLANT
GLOVE BIOGEL PI IND STRL 7.0 (GLOVE) ×3 IMPLANT
GLOVE BIOGEL PI IND STRL 8 (GLOVE) ×3 IMPLANT
GLOVE SURG POLYISO LF SZ6.5 (GLOVE) ×3 IMPLANT
GOWN STRL REUS W/ TWL XL LVL3 (GOWN DISPOSABLE) ×3 IMPLANT
GOWN STRL REUS W/TWL XL LVL3 (GOWN DISPOSABLE) ×2
KIT BASIN OR (CUSTOM PROCEDURE TRAY) ×3 IMPLANT
KIT TURNOVER KIT A (KITS) IMPLANT
MANIFOLD NEPTUNE II (INSTRUMENTS) ×3 IMPLANT
NDL MAYO CATGUT SZ4 TPR NDL (NEEDLE) ×2 IMPLANT
NEEDLE MAYO CATGUT SZ4 (NEEDLE) ×2 IMPLANT
NS IRRIG 1000ML POUR BTL (IV SOLUTION) ×2 IMPLANT
PACK SHOULDER (CUSTOM PROCEDURE TRAY) ×3 IMPLANT
PAD COLD SHLDR WRAP-ON (PAD) ×3 IMPLANT
PROTECTOR NERVE ULNAR (MISCELLANEOUS) ×3 IMPLANT
PROXIMAL BODY PTC 13X132.5 (Joint) ×2 IMPLANT
PRT COAT DISTL STEM 13 SHOUL (Miscellaneous) ×2 IMPLANT
RESTRAINT HEAD UNIVERSAL NS (MISCELLANEOUS) ×3 IMPLANT
SCREW ASSEMBLY COCR TYPE 0 (Screw) ×1 IMPLANT
SHOULDER SYSTEM 33 OD 12.5DEG (Joint) ×2 IMPLANT
SLING ARM IMMOBILIZER LRG (SOFTGOODS) ×3 IMPLANT
STAPLER VISISTAT 35W (STAPLE) ×1 IMPLANT
STEM SHLDR DIST PRTLY COATD 13 (Miscellaneous) ×1 IMPLANT
STRIP CLOSURE SKIN 1/2X4 (GAUZE/BANDAGES/DRESSINGS) ×2 IMPLANT
SUPPORT WRAP ARM LG (MISCELLANEOUS) ×3 IMPLANT
SUT MNCRL AB 4-0 PS2 18 (SUTURE) ×3 IMPLANT
SUT VIC AB 2-0 CT1 27 (SUTURE) ×4
SUT VIC AB 2-0 CT1 TAPERPNT 27 (SUTURE) ×4 IMPLANT
SUT VIC AB 4-0 PS2 27 (SUTURE) IMPLANT
SYSTEM SHOULDER 33 OD 12.5DEG (Joint) ×1 IMPLANT
TOWEL OR 17X26 10 PK STRL BLUE (TOWEL DISPOSABLE) ×3 IMPLANT
TRAY REV FLEX AEQUALIS 3.5X6 (Shoulder) ×1 IMPLANT
WATER STERILE IRR 1000ML POUR (IV SOLUTION) ×2 IMPLANT

## 2022-07-27 NOTE — Anesthesia Procedure Notes (Signed)
Procedure Name: Intubation Date/Time: 07/27/2022 2:17 PM  Performed by: Donna Bernard, CRNAPre-anesthesia Checklist: Patient identified, Emergency Drugs available, Suction available, Patient being monitored and Timeout performed Patient Re-evaluated:Patient Re-evaluated prior to induction Oxygen Delivery Method: Circle system utilized Preoxygenation: Pre-oxygenation with 100% oxygen Induction Type: IV induction Ventilation: Mask ventilation without difficulty Laryngoscope Size: Miller and 2 Grade View: Grade I Tube type: Oral Tube size: 7.0 mm Number of attempts: 1 Airway Equipment and Method: Stylet Placement Confirmation: positive ETCO2, ETT inserted through vocal cords under direct vision, CO2 detector and breath sounds checked- equal and bilateral Secured at: 22 cm Tube secured with: Tape Dental Injury: Teeth and Oropharynx as per pre-operative assessment

## 2022-07-27 NOTE — Op Note (Signed)
Procedure(s):   Candice Nolan female 82 y.o. 07/27/2022  Preoperative diagnosis: Left periprosthetic proximal humerus fracture around reverse total shoulder arthroplasty  Postoperative diagnosis: Same  Procedure performed: Excision of stem with proximal segment of fracture with revision to longstem press-fit humeral stem   Indications:  82 y.o. female status post left reverse shoulder arthroplasty with fall directly onto the shoulder 1 week ago.  She was found to have displaced periprosthetic proximal humerus fracture.  Indicated for surgical treatment to try and promote healing and restore function.  We talked about ORIF versus revision to longer stem.  She was to proceed with surgery.     Surgeon: Glennon Hamilton   Assistants: Fredia Sorrow PA-C [name] was present and scrubbed throughout the procedure and was essential in positioning, retraction, exposure, and closure)  Anesthesia: General endotracheal anesthesia with preoperative interscalene block given by the attending anesthesiologist     Procedure Detail    Estimated Blood Loss:  300 mL         Drains: none  Blood Given: none          Specimens: none        Complications:  * No complications entered in OR log *         Disposition: PACU - hemodynamically stable.         Condition: stable      OPERATIVE FINDINGS:  An attempt was made at reducing the fracture.  The fracture was noted to be comminuted and very unstable.  There was no usable bone proximally to allow for proximal fixation.  I did not feel that cerclage fixation alone proximally would be adequate.  Therefore the decision was made to proceed with resection of the proximal segment and revision to a longstem press-fit implant.  A Tornier revive stem was used with excellent stability.  The 33 poly was used to match the 32 glenosphere.  PROCEDURE: The patient was identified in the preoperative holding area  where I personally marked the operative  site after verifying site, side,  and procedure with the patient. An interscalene block given by  the attending anesthesiologist in the holding area and the patient was taken back to the operating room where all extremities were  carefully padded in position after general anesthesia was induced. She  was placed in a beach-chair position and the operative upper extremity was  prepped and draped in a standard sterile fashion. An approximately 15-  cm incision was made in line with her previous incision and then extended distally.  Dissection was carried down through subcutaneous tissues to the fascia.  The cephalic vein was identified distally and used to traced up the scarred in the deltopectoral interval.  The deltoid was taken laterally with the pectoralis major taken medially.  The underlying fracture and implant was identified and exposed.  After freeing up surrounding scar tissue the fracture was reduced but was noted to be highly unstable.  There was significant mount of medial comminution and at this point the decision was made that open reduction internal fixation would not be successful especially given the lack of lateral bone proximally.  The proximal implant was noted to be well-fixed but there was a very small shell of bone surrounding it.  At this point the proximal metaphysis and implant were carefully resected coming around taking care to stay directly on bone.  After full resection the proximal shaft was exposed.  The shaft was sized with the reamers.  The appropriate sized trial  was then placed and built to the appropriate height.  This was reduced and felt to be nicely stable and with appropriate soft tissue tension and length.  At this point the final construct was put together on the back table and implanted in a press-fit fashion.  The joint was reduced and noted to be very stable.  Range of motion was appropriate.  X-ray was used to verify position of the implant and had a nice press-fit  distally.  Length and alignment appeared appropriate.  At this point copious irrigation was used and the wound was closed in layers with 2-0 Vicryl and staples for skin closure.  A light sterile dressing was applied.  The patient was then placed in a sling immobilizer transferred to the stretcher and taken to the recovery room in stable condition.  POSTOPERATIVE PLAN: The patient will be kept in the hospital postoperatively  for pain control and observation.  She requires self-catheterization and so we will leave a Foley catheter in overnight to alleviate this need as she recovers from surgery.Marland Kitchen

## 2022-07-27 NOTE — Discharge Instructions (Addendum)
Discharge Instructions   A sling has been provided for you. Remain in your sling at all times. This includes sleeping in your sling.  Use ice on the shoulder intermittently over the first 48 hours after surgery.  Pain medicine has been prescribed for you.  Use your medicine liberally over the first 48 hours, and then you can begin to taper your use. You may take Extra Strength Tylenol or Tylenol only in place of the pain pills. DO NOT take ANY nonsteroidal anti-inflammatory pain medications: Advil, Motrin, Ibuprofen, Aleve, Naproxen or Naprosyn.  You may change the dressing 3 days after surgery and replace with gauze and paper tape. It cannot get wet for at least 5 days.  No driving Resume aspirin the day after surgery   Please call [phone] during normal business hours or [phone] after hours for any problems. Including the following:  - excessive redness of the incisions - drainage for more than 4 days - fever of more than 101.5 F  *Please note that pain medications will not be refilled after hours or on weekends.

## 2022-07-27 NOTE — Anesthesia Postprocedure Evaluation (Signed)
Anesthesia Post Note  Patient: Candice Nolan  Procedure(s) Performed: REVISION HUMERAL HEAD REVERSE SHOULDER ARTHROPLASTY (Shoulder)     Patient location during evaluation: PACU Anesthesia Type: General Level of consciousness: awake and alert Pain management: pain level controlled Vital Signs Assessment: post-procedure vital signs reviewed and stable Respiratory status: spontaneous breathing, nonlabored ventilation and respiratory function stable Cardiovascular status: stable and blood pressure returned to baseline Anesthetic complications: no   No notable events documented.  Last Vitals:  Vitals:   07/27/22 1630 07/27/22 1645  BP: (!) 151/79 127/61  Pulse: 85 88  Resp: 16 19  Temp: 36.5 C   SpO2: 97% 95%    Last Pain:  Vitals:   07/27/22 1645  TempSrc:   PainSc: 0-No pain                 Beryle Lathe

## 2022-07-27 NOTE — Progress Notes (Signed)
Candise Bowens RN was notified that patient self catheterizes herself 5 times daily. Patient currently has a foley in place but is concerned when she goes home that she will not be able to catheterize herself due to being left handed and she had surgery on her left shoulder. Patient lives in independent living at [facility].

## 2022-07-27 NOTE — Anesthesia Procedure Notes (Signed)
Anesthesia Regional Block: Interscalene brachial plexus block   Pre-Anesthetic Checklist: , timeout performed,  Correct Patient, Correct Site, Correct Laterality,  Correct Procedure, Correct Position, site marked,  Risks and benefits discussed,  Surgical consent,  Pre-op evaluation,  At surgeon's request and post-op pain management  Laterality: Left  Prep: chloraprep       Needles:  Injection technique: Single-shot  Needle Type: Echogenic Needle     Needle Length: 5cm  Needle Gauge: 21     Additional Needles:   Narrative:  Start time: 07/27/2022 1:50 PM End time: 07/27/2022 1:53 PM Injection made incrementally with aspirations every 5 mL.  Performed by: Personally  Anesthesiologist: Beryle Lathe, MD  Additional Notes: No pain on injection. No increased resistance to injection. Injection made in 5cc increments. Good needle visualization. Patient tolerated the procedure well.

## 2022-07-27 NOTE — Anesthesia Preprocedure Evaluation (Addendum)
Anesthesia Evaluation  Patient identified by MRN, date of birth, ID band Patient awake    Reviewed: Allergy & Precautions, NPO status , Patient's Chart, lab work & pertinent test results  History of Anesthesia Complications Negative for: history of anesthetic complications  Airway Mallampati: I  TM Distance: >3 FB Neck ROM: Full    Dental  (+) Dental Advisory Given, Partial Upper, Partial Lower   Pulmonary neg pulmonary ROS   Pulmonary exam normal        Cardiovascular hypertension, Pt. on medications Normal cardiovascular exam     Neuro/Psych  PSYCHIATRIC DISORDERS Anxiety Depression    negative neurological ROS     GI/Hepatic Neg liver ROS,GERD  Controlled,,  Endo/Other  negative endocrine ROS    Renal/GU negative Renal ROS  Female GU complaint     Musculoskeletal  (+) Arthritis ,    Abdominal   Peds  Hematology negative hematology ROS (+)   Anesthesia Other Findings   Reproductive/Obstetrics                             Anesthesia Physical Anesthesia Plan  ASA: 2  Anesthesia Plan: General   Post-op Pain Management: Tylenol PO (pre-op)* and Regional block*   Induction: Intravenous  PONV Risk Score and Plan: 3 and Treatment may vary due to age or medical condition, Ondansetron and Propofol infusion  Airway Management Planned: Oral ETT  Additional Equipment: None  Intra-op Plan:   Post-operative Plan: Extubation in OR  Informed Consent: I have reviewed the patients History and Physical, chart, labs and discussed the procedure including the risks, benefits and alternatives for the proposed anesthesia with the patient or authorized representative who has indicated his/her understanding and acceptance.     Dental advisory given  Plan Discussed with: CRNA and Anesthesiologist  Anesthesia Plan Comments:         Anesthesia Quick Evaluation

## 2022-07-27 NOTE — Transfer of Care (Signed)
Immediate Anesthesia Transfer of Care Note  Patient: Guillermina A Vahle  Procedure(s) Performed: REVISION HUMERAL HEAD REVERSE SHOULDER ARTHROPLASTY (Shoulder)  Patient Location: PACU  Anesthesia Type:General  Level of Consciousness: sedated  Airway & Oxygen Therapy: Patient Spontanous Breathing and Patient connected to face mask oxygen  Post-op Assessment: Report given to RN and Post -op Vital signs reviewed and stable  Post vital signs: Reviewed and stable  Last Vitals:  Vitals Value Taken Time  BP 150/51 07/27/22 1600  Temp    Pulse 87 07/27/22 1602  Resp 18 07/27/22 1602  SpO2 99 % 07/27/22 1602  Vitals shown include unfiled device data.  Last Pain:  Vitals:   07/27/22 1349  TempSrc: Oral  PainSc:          Complications: No notable events documented.

## 2022-07-27 NOTE — H&P (Signed)
Candice Nolan is an 82 y.o. female.   Chief Complaint: L shoulder injury  HPI:  s/p L reverse TSA with recent fall and periprosthetic fracture.  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 Received Suprane and sccinylcholine 03/15/09 w/o issue.    Depression    GERD (gastroesophageal reflux disease)    H/O bladder infections    Incontinence    at young age , and bowel as a young adult . Had bladder reconstruction and part of colon was used as top of bladder . Pt. self caths 4 x a day   Macular degeneration    Pneumonia    as a child   Sleep related leg cramps    and in water during exercies    Past Surgical History:  Procedure Laterality Date   ABDOMINAL HYSTERECTOMY     APPENDECTOMY     BICEPS TENDON REPAIR     BLADDER AUGMENTATION     carpel tunnel Bilateral    x2   EYE SURGERY     bilateral cataracts   KNEE ARTHROSCOPY Bilateral    numerous times   REVERSE SHOULDER ARTHROPLASTY Right 01/18/2017   REVERSE SHOULDER ARTHROPLASTY Right 01/18/2017   Procedure: REVERSE SHOULDER ARTHROPLASTY;  Surgeon: Jones Broom, MD;  Location: [facility] OR;  Service: Orthopedics;  Laterality: Right;  Right reverse total shoulder arthroplasty   TONSILLECTOMY     TOTAL KNEE ARTHROPLASTY Left 12/09/2018   Procedure: LEFT TOTAL KNEE ARTHROPLASTY;  Surgeon: Gean Birchwood, MD;  Location: [facility] ORS;  Service: Orthopedics;  Laterality: Left;   TOTAL SHOULDER ARTHROPLASTY Left 08/15/2018   Procedure: REVERSE TOTAL SHOULDER ARTHROPLASTY;  Surgeon: Jones Broom, MD;  Location: [facility] ORS;  Service: Orthopedics;  Laterality: Left;   TUBAL LIGATION      History reviewed. No pertinent family history. Social History:  reports that she has never smoked. She has never used smokeless tobacco. She reports  current alcohol use. She reports that she does not use drugs.  Allergies:  Allergies  Allergen Reactions   Ascorbate Swelling    Facial ASORBIC ACID - can not have in pill form - foods are ok   Contrast Media [Iodinated Contrast Media] Shortness Of Breath   Iodine Shortness Of Breath   Chloramphenicol     UNSPECIFIED REACTION     Pentazocine Anxiety    Talwin    Tyloxapol Anxiety and Other (See Comments)    Medications Prior to Admission  Medication Sig Dispense Refill   amLODipine (NORVASC) 2.5 MG tablet Take 2.5 mg by mouth daily.     aspirin EC 81 MG tablet Take 1 tablet (81 mg total) by mouth 2 (two) times daily. (Patient taking differently: Take 81 mg by mouth daily.) 60 tablet 0   caffeine (STAY AWAKE MAXIMUM STRENGTH) 200 MG TABS tablet Take 100 mg by mouth every 4 (four) hours as needed (alertness).     CALCIUM-VITAMIN D PO Take 600 mg by mouth every evening.     CRANBERRY PO Take 650 mg by mouth daily. Gummy     desvenlafaxine (PRISTIQ) 100 MG 24 hr tablet Take 100 mg by mouth daily.     HYDROcodone-acetaminophen (NORCO/VICODIN) 5-325 MG tablet Take 1 tablet by mouth every 4 (four) hours as  needed for moderate pain or severe pain.     ibuprofen (ADVIL) 200 MG tablet Take 400 mg by mouth every 6 (six) hours as needed for mild pain or moderate pain.     meloxicam (MOBIC) 15 MG tablet Take 15 mg by mouth daily.     methylphenidate (RITALIN) 20 MG tablet Take 30 mg by mouth daily.     Multiple Vitamin (MULTIVITAMIN WITH MINERALS) TABS tablet Take 1 tablet by mouth daily.     Multiple Vitamins-Minerals (PRESERVISION AREDS 2+MULTI VIT) CAPS Take 1 tablet by mouth 2 (two) times daily.     Suvorexant (BELSOMRA) 10 MG TABS Take 10 mg by mouth at bedtime.     triamcinolone cream (KENALOG) 0.1 % Apply 1 Application topically daily as needed (bottom off foot).     Vitamin D, Ergocalciferol, (DRISDOL) 1.25 MG (50000 UNIT) CAPS capsule Take 1 capsule by mouth once a week.      Aflibercept (EYLEA IO) Inject into the eye See admin instructions. Every 16 weeks     ALPRAZolam (XANAX) 0.25 MG tablet Take 0.125 mg by mouth See admin instructions. take 1/2 tab 30 minutes prior to eye injections every 8 weeks     ARIPiprazole (ABILIFY) 2 MG tablet Take 2 mg by mouth daily.     BOTOX 100 units SOLR injection Inject 100 Units into the muscle every 3 (three) months.      Results for orders placed or performed during the hospital encounter of 07/27/22 (from the past 48 hour(s))  CBC     Status: Abnormal   Collection Time: 07/27/22 12:33 PM  Result Value Ref Range   WBC 10.0 4.0 - 10.5 K/uL   RBC 3.86 (L) 3.87 - 5.11 MIL/uL   Hemoglobin 11.4 (L) 12.0 - 15.0 g/dL   HCT 16.1 09.6 - 04.5 %   MCV 95.6 80.0 - 100.0 fL   MCH 29.5 26.0 - 34.0 pg   MCHC 30.9 30.0 - 36.0 g/dL   RDW 40.9 81.1 - 91.4 %   Platelets 277 150 - 400 K/uL   nRBC 0.0 0.0 - 0.2 %    Comment: Performed at [facility], [address] [place], [place] [address]   No results found.  Review of Systems  All other systems reviewed and are negative.   Blood pressure (!) 161/59, pulse 71, temperature 98.7 F (37.1 C), temperature source Oral, resp. rate 16, height 4\' 9"  (1.448 m), weight 66.2 kg, SpO2 98%. Physical Exam HENT:     Head: Atraumatic.  Eyes:     Extraocular Movements: Extraocular movements intact.  Cardiovascular:     Pulses: Normal pulses.  Pulmonary:     Effort: Pulmonary effort is normal.  Musculoskeletal:     Comments: LUE swollen/ ecchymotic , pain with any ROM. NVID.  Skin:    General: Skin is warm.  Psychiatric:        Mood and Affect: Mood normal.      Assessment/Plan s/p L reverse TSA with recent fall and periprosthetic fracture. Plan ORIF L periprosthetic fracture Risks / benefits of surgery discussed Consent on chart  NPO for OR Preop antibiotics   Glennon Hamilton, MD 07/27/2022, 1:30 PM

## 2022-07-28 DIAGNOSIS — S42292A Other displaced fracture of upper end of left humerus, initial encounter for closed fracture: Secondary | ICD-10-CM | POA: Diagnosis not present

## 2022-07-28 MED ORDER — OXYCODONE HCL 5 MG PO TABS: 5.0000 mg | ORAL_TABLET | ORAL | 0 refills | Status: AC | PRN

## 2022-07-28 MED ORDER — OXYCODONE HCL 5 MG PO TABS: 5.0000 mg | ORAL_TABLET | ORAL | 0 refills | Status: DC | PRN

## 2022-07-28 MED ORDER — METHOCARBAMOL 500 MG PO TABS: 500.0000 mg | ORAL_TABLET | Freq: Four times a day (QID) | ORAL | 0 refills | Status: DC | PRN

## 2022-07-28 MED ORDER — CHLORHEXIDINE GLUCONATE CLOTH 2 % EX PADS
6.0000 | MEDICATED_PAD | Freq: Every day | CUTANEOUS | Status: DC
  Administered 2022-07-28 – 2022-07-31 (×4): 6 via TOPICAL

## 2022-07-28 MED ORDER — METHOCARBAMOL 500 MG PO TABS: 500.0000 mg | ORAL_TABLET | Freq: Four times a day (QID) | ORAL | 0 refills | Status: AC | PRN

## 2022-07-28 NOTE — NC FL2 (Signed)
[place] MEDICAID FL2 LEVEL OF CARE FORM     IDENTIFICATION  Patient Name: Candice Nolan Birthdate: 1940-01-27 Sex: female Admission Date (Current Location): 07/27/2022  [facility] and [place] Number:  Producer, television/film/video and Address:  [facility],  [address], [place] [address]      Provider Number: [id number]  Attending Physician Name and Address:  Jones Broom, MD  Relative Name and Phone Number:  Lizbeth Bark (daughter) Ph: [phone]    Current Level of Care: Hospital Recommended Level of Care: Skilled Nursing Facility Prior Approval Number:    Date Approved/Denied:   PASRR Number: Pending  Discharge Plan: SNF    Current Diagnoses: Patient Active Problem List   Diagnosis Date Noted   S/P ORIF (open reduction internal fixation) fracture 07/27/2022   S/P total knee arthroplasty, left 12/09/2018   Degenerative arthritis of left knee 12/05/2018   S/P reverse total shoulder arthroplasty, left 08/15/2018   S/P reverse total shoulder arthroplasty, right 01/18/2017    Orientation RESPIRATION BLADDER Height & Weight     Self, Time, Situation, Place  Normal Continent Weight: 146 lb (66.2 kg) Height:  4\' 9"  (144.8 cm)  BEHAVIORAL SYMPTOMS/MOOD NEUROLOGICAL BOWEL NUTRITION STATUS   (N/A)  (N/A) Continent Diet (Regular diet)  AMBULATORY STATUS COMMUNICATION OF NEEDS Skin   Limited Assist Verbally Surgical wounds                       Personal Care Assistance Level of Assistance  Bathing, Feeding, Dressing Bathing Assistance: Limited assistance Feeding assistance: Independent Dressing Assistance: Limited assistance     Functional Limitations Info  Sight, Hearing, Speech Sight Info: Impaired Hearing Info: Impaired Speech Info: Adequate    SPECIAL CARE FACTORS FREQUENCY  PT (By licensed PT), OT (By licensed OT)     PT Frequency: 5x's/week OT Frequency: 5x's/week            Contractures Contractures Info: Not present     Additional Factors Info  Code Status, Psychotropic, Allergies Code Status Info: Full Allergies Info: Ascorbate, Contrast Media (Iodinated Contrast Media), Iodine, Chloramphenicol, Pentazocine, Tyloxapol Psychotropic Info: Effexor, Abilify         Current Medications (07/28/2022):  This is the current hospital active medication list Current Facility-Administered Medications  Medication Dose Route Frequency Provider Last Rate Last Admin   0.9 %  sodium chloride infusion   Intravenous Continuous Porterfield, Amber, PA-C   Stopped at 07/28/22 0441   acetaminophen (TYLENOL) tablet 1,000 mg  1,000 mg Oral Q6H Porterfield, Amber, PA-C   1,000 mg at 07/28/22 0530   acetaminophen (TYLENOL) tablet 325-650 mg  325-650 mg Oral Q6H PRN Porterfield, Amber, PA-C       amLODipine (NORVASC) tablet 2.5 mg  2.5 mg Oral Daily Porterfield, Amber, PA-C       ARIPiprazole (ABILIFY) tablet 2 mg  2 mg Oral Daily Porterfield, Amber, PA-C   2 mg at 07/28/22 4403   aspirin EC tablet 81 mg  81 mg Oral Daily Porterfield, Amber, PA-C   81 mg at 07/28/22 0825   bisacodyl (DULCOLAX) suppository 10 mg  10 mg Rectal Daily PRN Porterfield, Amber, PA-C       diphenhydrAMINE (BENADRYL) 12.5 MG/5ML elixir 12.5-25 mg  12.5-25 mg Oral Q4H PRN Porterfield, Amber, PA-C       docusate sodium (COLACE) capsule 100 mg  100 mg Oral BID Porterfield, Amber, PA-C   100 mg at 07/28/22 0824   HYDROmorphone (DILAUDID) injection 0.5-1 mg  0.5-1 mg Intravenous Q4H PRN Porterfield, Amber, PA-C       methocarbamol (ROBAXIN) tablet 500 mg  500 mg Oral Q6H PRN Porterfield, Amber, PA-C   500 mg at 07/28/22 9562   Or   methocarbamol (ROBAXIN) 500 mg in dextrose 5 % 50 mL IVPB  500 mg Intravenous Q6H PRN Porterfield, Amber, PA-C       metoCLOPramide (REGLAN) tablet 5-10 mg  5-10 mg Oral Q8H PRN Porterfield, Amber, PA-C       Or   metoCLOPramide (REGLAN) injection 5-10 mg  5-10 mg Intravenous Q8H PRN Porterfield, Amber, PA-C       ondansetron  (ZOFRAN) tablet 4 mg  4 mg Oral Q6H PRN Porterfield, Amber, PA-C       Or   ondansetron (ZOFRAN) injection 4 mg  4 mg Intravenous Q6H PRN Porterfield, Amber, PA-C       oxyCODONE (Oxy IR/ROXICODONE) immediate release tablet 10-15 mg  10-15 mg Oral Q4H PRN Porterfield, Amber, PA-C       oxyCODONE (Oxy IR/ROXICODONE) immediate release tablet 5-10 mg  5-10 mg Oral Q4H PRN Porterfield, Amber, PA-C   5 mg at 07/28/22 1236   senna-docusate (Senokot-S) tablet 1 tablet  1 tablet Oral QHS PRN Porterfield, Amber, PA-C       venlafaxine XR (EFFEXOR-XR) 24 hr capsule 150 mg  150 mg Oral Q breakfast Porterfield, Amber, PA-C   150 mg at 07/28/22 0825     Discharge Medications: Please see discharge summary for a list of discharge medications.  Relevant Imaging Results:  Relevant Lab Results:   Additional Information SSN: [id number]  Ewing Schlein, LCSW

## 2022-07-28 NOTE — TOC PASRR Note (Addendum)
30 Day PASRR Note  Patient Details  Name: Candice Nolan Date of Birth: 11-02-1940  Transition of Care [facility] CM/SW Contact:    Ewing Schlein, LCSW Phone Number: 07/28/2022, 1:57 PM  To Whom It May Concern:  Please be advised that this patient will require a short-term nursing home stay - anticipated 30 days or less for rehabilitation and strengthening. The plan is for return home.

## 2022-07-28 NOTE — Evaluation (Signed)
Physical Therapy Evaluation Patient Details Name: Candice Nolan MRN: [id number] DOB: [birth year] Today's Date: 07/28/2022  History of Present Illness  Pt is an 82 year old female s/p REVISION HUMERAL HEAD REVERSE SHOULDER ARTHROPLASTY on 07/27/22.  PMH: L TKA, macular degeneration, arthritis, skin CA (Ms. Candice Nolan is a 82 yr old female who is s/p PMH: L TKA, macular degeneration, arthritis, skin CA)  Clinical Impression  Patient is s/p above surgery resulting in functional limitations due to the deficits listed below (see PT Problem List).   Patient will benefit from acute skilled PT to increase their independence and safety with mobility to facilitate discharge.  Pt reports being very independent at baseline however now has restrictions for wearing sling and maintaining L UE NWB.  Pt is also left handed.  Pt will likely require more assist for ADLs upon d/c and started gait training with hemiwalker today. She typically uses rollator.  Patient will benefit from continued inpatient follow up therapy, <3 hours/day.           Assistance Recommended at Discharge PRN  If plan is discharge home, recommend the following:  Can travel by private vehicle  A little help with walking and/or transfers;A lot of help with bathing/dressing/bathroom;Help with stairs or ramp for entrance;Assistance with cooking/housework   Yes    Equipment Recommendations Other (comment) (hemiwalker)  Recommendations for Other Services       Functional Status Assessment Patient has had a recent decline in their functional status and demonstrates the ability to make significant improvements in function in a reasonable and predictable amount of time.     Precautions / Restrictions Precautions Precautions: Fall;Shoulder Type of Shoulder Precautions: "Patient is to remain in sling at all times. She can technically come out of the sling for gentle hand, wrist, and elbow motion but if there is any concern from OT that  she is not comprehending that and she is moving her shoulder then she should remain in the sling at ALL times" Shoulder Interventions: At all times;Shoulder sling/immobilizer Restrictions Weight Bearing Restrictions: Yes LUE Weight Bearing: Non weight bearing      Mobility  Bed Mobility Overal bed mobility: Needs Assistance Bed Mobility: Supine to Sit     Supine to sit: Min assist, HOB elevated     General bed mobility comments: very light assist for trunk, sling slightly offset so therapist secured for better positioning once sitting EOB    Transfers Overall transfer level: Needs assistance Equipment used: 1 person hand held assist Transfers: Sit to/from Stand Sit to Stand: Min guard, Min assist           General transfer comment: light assist to rise, cues for hand to self assist    Ambulation/Gait Ambulation/Gait assistance: Min assist, Min guard Gait Distance (Feet): 200 Feet Assistive device: 1 person hand held assist Gait Pattern/deviations: Step-through pattern, Decreased stride length Gait velocity: decr     General Gait Details: small step, provided HHA and pt requiring HHA for stability; returned with hemiwalker and pt educated on gait pattern and safety with hemiwalker and ambulated another 80 feet  Stairs            Wheelchair Mobility     Tilt Bed    Modified Rankin (Stroke Patients Only)       Balance Overall balance assessment: History of Falls  Pertinent Vitals/Pain Pain Assessment Pain Location: left shoulder with transitional movement (sling in place at all times)    Home Living Family/patient expects to be discharged to:: Skilled nursing facility Living Arrangements: Alone   Type of Home: Independent living facility [facility])         Home Layout: One level Home Equipment: Agricultural consultant (2 wheels);Rollator (4 wheels);Tub bench Additional Comments: she also  has a reacher and sock aid    Prior Function Prior Level of Function : Independent/Modified Independent             Mobility Comments:  (She uses a rollator.) ADLs Comments: She was independent with ADLs. Meals and cleaning services are provided by the facility.     Hand Dominance   Dominant Hand: Left    Extremity/Trunk Assessment   Upper Extremity Assessment Upper Extremity Assessment: Defer to OT evaluation RUE Deficits / Details: AROM and strength WFL LUE Deficits / Details: Hand, wrist, and elbow AROM WFL. Shoulder not assessed, given post-op precautions and applied sling    Lower Extremity Assessment Lower Extremity Assessment: Overall WFL for tasks assessed       Communication   Communication: No difficulties  Cognition Arousal/Alertness: Awake/alert Behavior During Therapy: WFL for tasks assessed/performed Overall Cognitive Status: Within Functional Limits for tasks assessed                                          General Comments      Exercises     Assessment/Plan    PT Assessment Patient needs continued PT services  PT Problem List Decreased strength;Decreased activity tolerance;Decreased mobility;Decreased balance;Decreased knowledge of precautions;Decreased knowledge of use of DME       PT Treatment Interventions Gait training;DME instruction;Therapeutic exercise;Balance training;Therapeutic activities;Patient/family education;Functional mobility training    PT Goals (Current goals can be found in the Care Plan section)  Acute Rehab PT Goals PT Goal Formulation: With patient Time For Goal Achievement: 08/11/22 Potential to Achieve Goals: Good    Frequency Min 1X/week     Co-evaluation               AM-PAC PT "6 Clicks" Mobility  Outcome Measure Help needed turning from your back to your side while in a flat bed without using bedrails?: A Little Help needed moving from lying on your back to sitting on the side of a  flat bed without using bedrails?: A Little Help needed moving to and from a bed to a chair (including a wheelchair)?: A Little Help needed standing up from a chair using your arms (e.g., wheelchair or bedside chair)?: A Little Help needed to walk in hospital room?: A Little Help needed climbing 3-5 steps with a railing? : A Lot 6 Click Score: 17    End of Session Equipment Utilized During Treatment: Gait belt Activity Tolerance: Patient tolerated treatment well Patient left: with call bell/phone within reach;in chair;with chair alarm set;with family/visitor present Nurse Communication: Mobility status PT Visit Diagnosis: Difficulty in walking, not elsewhere classified (R26.2)    Time: 1125-1138 (+5 minutes for ambulating with hemiwalker) PT Time Calculation (min) (ACUTE ONLY): 13 min   Charges:   PT Evaluation $PT Eval Low Complexity: 1 Low   PT General Charges $$ ACUTE PT VISIT: 1 Visit       Thomasene Mohair PT, DPT Physical Therapist Acute Rehabilitation Services Office: [phone]   Janan Halter [place] 07/28/2022,  1:13 PM

## 2022-07-28 NOTE — Progress Notes (Signed)
PATIENT ID: Candice Nolan  MRN: [id number]  DOB/AGE:  04-Feb-1940 / 82 y.o.  1 Day Post-Op Procedure(s): REVISION HUMERAL HEAD REVERSE SHOULDER ARTHROPLASTY  Subjective: Patient reports minimal pain in the left shoulder. Reports anxiety and fear that people are upset with her for falling. She has concerns about being able to self cath once she is DC since she is left handed.   Objective: Vital signs in last 24 hours: Temp:  [97.6 F (36.4 C)-98.7 F (37.1 C)] 97.7 F (36.5 C) (07/26 0516) Pulse Rate:  [62-88] 65 (07/26 0516) Resp:  [10-24] 17 (07/26 0516) BP: (106-166)/(47-79) 124/60 (07/26 0516) SpO2:  [92 %-100 %] 100 % (07/26 0516) Weight:  [66.2 kg] 66.2 kg (07/25 1159)  Intake/Output from previous day: 07/25 0701 - 07/26 0700 In: 1450.8 [P.O.:360; I.V.:990.8; IV Piggyback:100] Out: 1900 [Urine:1750; Blood:150] Intake/Output this shift: Total I/O In: 360 [P.O.:360] Out: 100 [Urine:100]  Recent Labs    07/27/22 1233 07/28/22 0340  HGB 11.4* 9.2*   Recent Labs    07/27/22 1233 07/28/22 0340  WBC 10.0 10.7*  RBC 3.86* 3.14*  HCT 36.9 30.4*  PLT 277 286   Recent Labs    07/27/22 1233  NA 137  K 4.0  CL 102  CO2 24  BUN 16  CREATININE 0.58  GLUCOSE 88  CALCIUM 9.0     Physical Exam: Neurologically intact Sensation intact distally Intact pulses distally Incision: dressing C/D/I No cellulitis present Compartment soft Able to move left hand and wrist without difficulty Sling in place LUE  Assessment/Plan: 1 Day Post-Op Procedure(s): REVISION HUMERAL HEAD REVERSE SHOULDER ARTHROPLASTY   Advance diet Discharge to SNF- back to [facility] once arrangements made Non Weight Bearing (NWB) LUE VTE prophylaxis:  aspirin 81mg  daily  Patient is to remain in sling at all times. She can technically come out of the sling for gentle hand, wrist, and elbow motion but if there is any concern from OT that she is not comprehending that and she is moving her  shoulder then she should remain in the sling at ALL times. Reached out to urology to try to get catheter resource RN to help patient. TOC will meet with patient regarding return to [facility]. DC when arrangements made. Rx on chart. Follow up in 2 weeks.    [name], PA-C 07/28/2022, 8:27 AM

## 2022-07-28 NOTE — TOC Initial Note (Signed)
Transition of Care [facility]) - Initial/Assessment Note   Patient Details  Name: Candice Nolan MRN: [id number] Date of Birth: [birth year]  Transition of Care [facility]) CM/SW Contact:    Ewing Schlein, LCSW Phone Number: 07/28/2022, 1:40 PM  Clinical Narrative: Patient is from independent living at [facility]. Plan is to discharge to SNF at the facility. CSW confirmed SNF with [facility] in admissions.  FL2 done. PASRR pending; clinicals uploaded to [facility] MUST for review. Per Alphonzo Lemmings, patient will need the PASRR number and insurance approval or denial for SNF before patient can return under her grace days.  Expected Discharge Plan: Skilled Nursing Facility Barriers to Discharge: Insurance Authorization  Patient Goals and CMS Choice Patient states their goals for this hospitalization and ongoing recovery are:: Go to rehab at [email or web address].gov Compare Post Acute Care list provided to:: Patient Choice offered to / list presented to : Patient  Expected Discharge Plan and Services In-house Referral: Clinical Social Work Post Acute Care Choice: Skilled Nursing Facility Living arrangements for the past 2 months: Independent Living Facility           DME Arranged: N/A DME Agency: NA  Prior Living Arrangements/Services Living arrangements for the past 2 months: Independent Living Facility Lives with:: Self Patient language and need for interpreter reviewed:: Yes Do you feel safe going back to the place where you live?: Yes      Need for Family Participation in Patient Care: No (Comment) Care giver support system in place?: Yes (comment) Criminal Activity/Legal Involvement Pertinent to Current Situation/Hospitalization: No - Comment as needed  Activities of Daily Living Home Assistive Devices/Equipment: Dentures (specify type), Eyeglasses, Hearing aid ADL Screening (condition at time of admission) Patient's cognitive ability adequate to safely complete daily activities?: Yes Is the patient  deaf or have difficulty hearing?: Yes Does the patient have difficulty seeing, even when wearing glasses/contacts?: No Does the patient have difficulty concentrating, remembering, or making decisions?: No Patient able to express need for assistance with ADLs?: Yes Does the patient have difficulty dressing or bathing?: Yes Independently performs ADLs?: No Communication: Independent Dressing (OT): Needs assistance Is this a change from baseline?: Pre-admission baseline Grooming: Needs assistance Is this a change from baseline?: Pre-admission baseline Feeding: Needs assistance Is this a change from baseline?: Pre-admission baseline Bathing: Needs assistance Is this a change from baseline?: Pre-admission baseline Toileting: Needs assistance Is this a change from baseline?: Pre-admission baseline In/Out Bed: Needs assistance Is this a change from baseline?: Pre-admission baseline Walks in Home: Independent Is this a change from baseline?: Pre-admission baseline Does the patient have difficulty walking or climbing stairs?: No Weakness of Legs: None Weakness of Arms/Hands: Left  Permission Sought/Granted Permission sought to share information with : Facility Industrial/product designer granted to share information with : Yes, Verbal Permission Granted Permission granted to share info w AGENCY: [facility]  Emotional Assessment Appearance:: Appears stated age Attitude/Demeanor/Rapport: Engaged Affect (typically observed): Accepting Orientation: : Oriented to Self, Oriented to Place, Oriented to  Time, Oriented to Situation Alcohol / Substance Use: Not Applicable Psych Involvement: No (comment)  Admission diagnosis:  S/P ORIF (open reduction internal fixation) fracture [U44.034, Z87.81] Patient Active Problem List   Diagnosis Date Noted   S/P ORIF (open reduction internal fixation) fracture 07/27/2022   S/P total knee arthroplasty, left 12/09/2018   Degenerative arthritis of  left knee 12/05/2018   S/P reverse total shoulder arthroplasty, left 08/15/2018   S/P reverse total shoulder arthroplasty, right 01/18/2017   PCP:  Wilhelmina Mcardle  J, MD Pharmacy:   [id number] - [place], [place] - [address] [place] [address] Phone: [phone] Fax: [phone]  Social Determinants of Health (SDOH) Social History: SDOH Screenings   Food Insecurity: No Food Insecurity (07/27/2022)  Housing: Low Risk  (07/27/2022)  Transportation Needs: No Transportation Needs (07/27/2022)  Utilities: Not At Risk (07/27/2022)  Tobacco Use: Low Risk  (07/27/2022)   SDOH Interventions:    Readmission Risk Interventions     No data to display

## 2022-07-28 NOTE — Progress Notes (Addendum)
Patient ID: Candice Nolan MRN: [id number] DOB/AGE: August 02, 1940 82 y.o.  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 Received Suprane and sccinylcholine 03/15/09 w/o issue.    Depression    GERD (gastroesophageal reflux disease)    H/O bladder infections    Incontinence    at young age , and bowel as a young adult . Had bladder reconstruction and part of colon was used as top of bladder . Pt. self caths 4 x a day   Macular degeneration    Pneumonia    as a child   Sleep related leg cramps    and in water during exercies    Surgeries: Procedure(s): REVISION HUMERAL HEAD REVERSE SHOULDER ARTHROPLASTY on 07/27/2022   Consultants:   Discharged Condition: Improved  Hospital Course: Majesta Severn Schooley is an 82 y.o. female who was admitted 07/27/2022 for operative treatment ofS/P ORIF (open reduction internal fixation) fracture. Patient has severe unremitting pain that affects sleep, daily activities, and work/hobbies. After pre-op clearance the patient was taken to the operating room on 07/27/2022 and underwent  Procedure(s): REVISION HUMERAL HEAD REVERSE SHOULDER ARTHROPLASTY.    Patient was given perioperative antibiotics:  Anti-infectives (From admission, onward)    Start     Dose/Rate Route Frequency Ordered Stop   07/27/22 2000  ceFAZolin (ANCEF) IVPB 1 g/50 mL premix        1 g 100 mL/hr over 30 Minutes Intravenous Every 6 hours 07/27/22 1732 07/28/22 0918   07/27/22  1145  ceFAZolin (ANCEF) IVPB 2g/100 mL premix        2 g 200 mL/hr over 30 Minutes Intravenous On call to O.R. 07/27/22 1140 07/27/22 1422        Patient was given sequential compression devices, early ambulation, and chemoprophylaxis to prevent DVT. Patient met with OT and PT.  Patient benefited maximally from hospital stay and there were no complications. Arrangements were made for discharge back to [facility] where she normally lives in independent living. Patient self cath so may require help from staff at [facility] for cath PRN as she must stay in the sling at all times except when showering. This is the preferred option as opposed to keeping foley in for 6 weeks.   Recent vital signs: Patient Vitals for the past 24 hrs:  BP Temp Temp src Pulse Resp SpO2 Height Weight  07/28/22 0516 124/60 97.7 F (36.5 C) -- 65 17 100 % -- --  07/28/22 0121 (!) 106/47 97.6 F (36.4 C) -- 67 16 96 % -- --  07/27/22 2000 (!) 128/48 98 F (36.7 C) -- 86 17 97 % -- --  07/27/22 1746 (!) 145/69 98 F (36.7 C) Oral 82 18 95 % -- --  07/27/22 1645 127/61 -- -- 88 19 95 % -- --  07/27/22 1630 (!) 151/79 97.7 F (36.5 C) -- 85 16 97 % -- --  07/27/22 1615 (!) 146/63 -- -- 83 16 95 % -- --  07/27/22 1600 (!) 150/51  97.6 F (36.4 C) -- 87 17 99 % -- --  07/27/22 1401 -- -- -- 66 12 95 % -- --  07/27/22 1400 (!) 144/63 -- -- 72 (!) 24 93 % -- --  07/27/22 1359 -- -- -- 62 13 94 % -- --  07/27/22 1358 -- -- -- 65 11 92 % -- --  07/27/22 1357 -- -- -- 64 13 95 % -- --  07/27/22 1356 -- -- -- 65 14 94 % -- --  07/27/22 1355 (!) 148/54 -- -- 68 14 94 % -- --  07/27/22 1354 -- -- -- 66 11 95 % -- --  07/27/22 1353 -- -- -- 72 14 97 % -- --  07/27/22 1352 -- -- -- 69 10 96 % -- --  07/27/22 1351 -- -- -- 72 14 96 % -- --  07/27/22 1350 (!) 162/57 -- -- 70 14 97 % -- --  07/27/22 1349 (!) 166/69 98.5 F (36.9 C) Oral 71 13 97 % -- --  07/27/22 1159 -- -- -- -- -- -- 4\' 9"  (1.448 m) 66.2 kg  07/27/22  1135 (!) 161/59 98.7 F (37.1 C) Oral 71 16 98 % -- --     Recent laboratory studies:  Recent Labs    07/27/22 1233 07/28/22 0340  WBC 10.0 10.7*  HGB 11.4* 9.2*  HCT 36.9 30.4*  PLT 277 286  NA 137  --   K 4.0  --   CL 102  --   CO2 24  --   BUN 16  --   CREATININE 0.58  --   GLUCOSE 88  --   CALCIUM 9.0  --      Discharge Medications:   Allergies as of 07/28/2022       Reactions   Ascorbate Swelling   Facial ASORBIC ACID - can not have in pill form - foods are ok   Contrast Media [iodinated Contrast Media] Shortness Of Breath   Iodine Shortness Of Breath   Chloramphenicol    UNSPECIFIED REACTION    Pentazocine Anxiety   Talwin    Tyloxapol Anxiety, Other (See Comments)        Medication List     STOP taking these medications    HYDROcodone-acetaminophen 5-325 MG tablet Commonly known as: NORCO/VICODIN   ibuprofen 200 MG tablet Commonly known as: ADVIL   meloxicam 15 MG tablet Commonly known as: MOBIC       TAKE these medications    ALPRAZolam 0.25 MG tablet Commonly known as: XANAX Take 0.125 mg by mouth See admin instructions. take 1/2 tab 30 minutes prior to eye injections every 8 weeks   amLODipine 2.5 MG tablet Commonly known as: NORVASC Take 2.5 mg by mouth daily.   ARIPiprazole 2 MG tablet Commonly known as: ABILIFY Take 2 mg by mouth daily.   aspirin EC 81 MG tablet Take 1 tablet (81 mg total) by mouth 2 (two) times daily. What changed: when to take this   Belsomra 10 MG Tabs Generic drug: Suvorexant Take 10 mg by mouth at bedtime.   Botox 100 units Solr injection Generic drug: botulinum toxin Type A Inject 100 Units into the muscle every 3 (three) months.   CALCIUM-VITAMIN D PO Take 600 mg by mouth every evening.   CRANBERRY PO Take 650 mg by mouth daily. Gummy   desvenlafaxine 100 MG 24 hr tablet Commonly known as: PRISTIQ Take 100 mg by mouth daily.   EYLEA IO Inject into the eye  See admin instructions. Every  16 weeks   methocarbamol 500 MG tablet Commonly known as: ROBAXIN Take 1 tablet (500 mg total) by mouth every 6 (six) hours as needed for muscle spasms.   methylphenidate 20 MG tablet Commonly known as: RITALIN Take 30 mg by mouth daily.   multivitamin with minerals Tabs tablet Take 1 tablet by mouth daily.   oxyCODONE 5 MG immediate release tablet Commonly known as: Oxy IR/ROXICODONE Take 1 tablet (5 mg total) by mouth every 4 (four) hours as needed for moderate pain.   PreserVision AREDS 2+Multi Vit Caps Take 1 tablet by mouth 2 (two) times daily.   Stay Awake Maximum Strength 200 MG Tabs tablet Generic drug: caffeine Take 100 mg by mouth every 4 (four) hours as needed (alertness).   triamcinolone cream 0.1 % Commonly known as: KENALOG Apply 1 Application topically daily as needed (bottom off foot).   Vitamin D (Ergocalciferol) 1.25 MG (50000 UNIT) Caps capsule Commonly known as: DRISDOL Take 1 capsule by mouth once a week.        Diagnostic Studies: DG Humerus Left  Result Date: 07/27/2022 CLINICAL DATA:  Shoulder after plasty revision EXAM: LEFT HUMERUS - 2+ VIEW COMPARISON:  06/26/2022 FLUOROSCOPY TIME:  Radiation Exposure Index (as provided by the fluoroscopic device): 0.67 mGy If the device does not provide the exposure index: Fluoroscopy Time:  2 seconds Number of Acquired Images:  3 FINDINGS: Initial images demonstrate proximal periprosthetic humeral fracture. The humeral component of the prosthesis within removed and extended version passing into the midshaft of the humerus was placed. No new fracture or dislocation is noted. IMPRESSION: Status post shoulder arthroplasty revision. Electronically Signed   By: Alcide Clever M.D.   On: 07/27/2022 19:34   DG C-Arm 1-60 Min-No Report  Result Date: 07/27/2022 Fluoroscopy was utilized by the requesting physician.  No radiographic interpretation.   DG C-Arm 1-60 Min-No Report  Result Date: 07/27/2022 Fluoroscopy was  utilized by the requesting physician.  No radiographic interpretation.    Disposition: SNF     Follow-up Information     [place], Hospital doctor, [employer]. Schedule an appointment as soon as possible for a visit on 08/11/2022.   Specialty: Orthopedic Surgery Contact information: [address] [place] [address] [phone]                  Signed: Joice Lofts L. [place], PA-C 07/28/2022, 11:03 AM

## 2022-07-28 NOTE — Evaluation (Signed)
Occupational Therapy Evaluation Patient Details Name: Candice Nolan MRN: [id number] DOB: March 19, 1940 Today's Date: 07/28/2022   History of Present Illness Pt is an 82 year old female s/p REVISION L HUMERAL HEAD REVERSE SHOULDER ARTHROPLASTY on 07/27/22.  PMH: L TKA, macular degeneration, arthritis, skin CA    Clinical Impression   The pt is currently limited by the below listed deficits (see OT problem list), which compromises her overall occupational performance. At current, she requires assist for self-care tasks, such as dressing, toileting, and bathing. She will benefit from further OT services to maximize her independence with self-care tasks and to decrease the risk for restricted participation in meaningful activities. She expresses concerns about being able to manage her self-care, as she lives alone and has to perform urinary self-catheterization; she normally uses her dominant LUE to self-cath, though now that would prove difficult, due to her recent LUE surgery. She is subsequently interested in post-acute therapy services to help her regain her functional independence. Patient will benefit from continued inpatient follow up therapy, <3 hours/day.       Recommendations for follow up therapy are one component of a multi-disciplinary discharge planning process, led by the attending physician.  Recommendations may be updated based on patient status, additional functional criteria and insurance authorization.   Assistance Recommended at Discharge Frequent or constant Supervision/Assistance  Patient can return home with the following A little help with walking and/or transfers;A lot of help with bathing/dressing/bathroom;Assistance with cooking/housework    Functional Status Assessment  Patient has had a recent decline in their functional status and demonstrates the ability to make significant improvements in function in a reasonable and predictable amount of time.  Equipment  Recommendations  Other (comment) (defer to next level of care)    Recommendations for Other Services       Precautions / Restrictions Precautions Precautions: Fall;Shoulder Type of Shoulder Precautions: "Patient is to remain in sling at all times. She can technically come out of the sling for gentle hand, wrist, and elbow motion but if there is any concern from OT that she is not comprehending that and she is moving her shoulder then she should remain in the sling at ALL times" Shoulder Interventions: At all times;Shoulder sling/immobilizer Restrictions Weight Bearing Restrictions: Yes LUE Weight Bearing: Non weight bearing      Mobility   Transfers Overall transfer level: Needs assistance Equipment used: 1 person hand held assist Transfers: Sit to/from Stand Sit to Stand: Min guard                  Balance Overall balance assessment: History of Falls   Sitting balance-Leahy Scale: Good Sitting balance - Comments: Min guard to min assist              ADL either performed or assessed with clinical judgement   ADL Overall ADL's : Needs assistance/impaired Eating/Feeding: Set up;Sitting Eating/Feeding Details (indicate cue type and reason): She required assist to open packets and remove lids, given LUE ROM limitations. Grooming: Sitting;Minimal assistance Grooming Details (indicate cue type and reason): based on clinical judgement         Upper Body Dressing : Moderate assistance;Sitting   Lower Body Dressing: Minimal assistance;Sit to/from stand   Toilet Transfer: Minimal assistance;Ambulation;Grab bars Toilet Transfer Details (indicate cue type and reason): At bathroom level, based on clinical judgement                 Vision   Additional Comments: She correctly read the time  depicted on the wall clock.            Pertinent Vitals/Pain Pain Assessment Pain Assessment: No/denies pain     Hand Dominance Left   Extremity/Trunk Assessment  Upper Extremity Assessment Upper Extremity Assessment: Defer to OT evaluation RUE Deficits / Details: AROM and strength WFL LUE Deficits / Details: Hand, wrist, and elbow AROM WFL. Shoulder not assessed, given post-op precautions and applied sling   Lower Extremity Assessment Lower Extremity Assessment: Overall WFL for tasks assessed       Communication Communication Communication: No difficulties   Cognition Arousal/Alertness: Awake/alert Behavior During Therapy: WFL for tasks assessed/performed        General Comments: Oriented x4, able to follow commands without difficulty                Home Living Family/patient expects to be discharged to:: Skilled nursing facility Living Arrangements: Alone   Type of Home: Independent living facility [facility])       Home Layout: One level     Bathroom Shower/Tub: Walk-in shower         Home Equipment: Agricultural consultant (2 wheels);Rollator (4 wheels);Tub bench   Additional Comments: she also has a reacher and sock aid      Prior Functioning/Environment Prior Level of Function : Independent/Modified Independent             Mobility Comments:  (She uses a rollator.) ADLs Comments: She was independent with ADLs. Meals and cleaning services are provided by the facility.        OT Problem List: Decreased strength;Decreased range of motion;Impaired balance (sitting and/or standing);Decreased knowledge of use of DME or AE;Decreased knowledge of precautions;Impaired UE functional use      OT Treatment/Interventions: Self-care/ADL training;Therapeutic exercise;Energy conservation;DME and/or AE instruction;Therapeutic activities;Balance training;Patient/family education    OT Goals(Current goals can be found in the care plan section) Acute Rehab OT Goals Patient Stated Goal: for her shoulder to heal and to regain her functional independence OT Goal Formulation: With patient Time For Goal Achievement: 08/11/22 Potential  to Achieve Goals: Good ADL Goals Pt Will Perform Upper Body Dressing: with supervision;with set-up;sitting Pt Will Perform Lower Body Dressing: with supervision;with set-up;sit to/from stand Pt Will Transfer to Toilet: with supervision;ambulating Pt Will Perform Toileting - Clothing Manipulation and hygiene: with supervision;sit to/from stand  OT Frequency: Min 1X/week       AM-PAC OT "6 Clicks" Daily Activity     Outcome Measure Help from another person eating meals?: A Little Help from another person taking care of personal grooming?: A Little Help from another person toileting, which includes using toliet, bedpan, or urinal?: A Little Help from another person bathing (including washing, rinsing, drying)?: A Lot Help from another person to put on and taking off regular upper body clothing?: A Lot Help from another person to put on and taking off regular lower body clothing?: A Little 6 Click Score: 16   End of Session Equipment Utilized During Treatment: Other (comment) Nurse Communication: Other (comment) (Pt inquiring about a different LUE sling)  Activity Tolerance: Patient tolerated treatment well Patient left: in chair;with call bell/phone within reach;with family/visitor present  OT Visit Diagnosis: Unsteadiness on feet (R26.81);History of falling (Z91.81)                Time: 8119-1478 OT Time Calculation (min): 20 min Charges:  OT General Charges $OT Visit: 1 Visit OT Evaluation $OT Eval Moderate Complexity: 1 Mod    Bergen Magner L Antione Obar, OTR/L 07/28/2022,  1:16 PM

## 2022-07-28 NOTE — Plan of Care (Signed)
  Problem: Education: Goal: Knowledge of General Education information will improve Description: Including pain rating scale, medication(s)/side effects and non-pharmacologic comfort measures Outcome: Adequate for Discharge   Problem: Health Behavior/Discharge Planning: Goal: Ability to manage health-related needs will improve Outcome: Adequate for Discharge   Problem: Clinical Measurements: Goal: Ability to maintain clinical measurements within normal limits will improve Outcome: Adequate for Discharge Goal: Will remain free from infection Outcome: Adequate for Discharge Goal: Diagnostic test results will improve Outcome: Adequate for Discharge Goal: Respiratory complications will improve Outcome: Adequate for Discharge Goal: Cardiovascular complication will be avoided Outcome: Adequate for Discharge   Problem: Activity: Goal: Risk for activity intolerance will decrease Outcome: Adequate for Discharge   Problem: Nutrition: Goal: Adequate nutrition will be maintained Outcome: Adequate for Discharge   Problem: Coping: Goal: Level of anxiety will decrease Outcome: Adequate for Discharge   Problem: Elimination: Goal: Will not experience complications related to bowel motility Outcome: Adequate for Discharge Goal: Will not experience complications related to urinary retention Outcome: Adequate for Discharge   Problem: Pain Managment: Goal: General experience of comfort will improve Outcome: Adequate for Discharge   Problem: Safety: Goal: Ability to remain free from injury will improve Outcome: Adequate for Discharge   Problem: Skin Integrity: Goal: Risk for impaired skin integrity will decrease Outcome: Adequate for Discharge   Problem: Education: Goal: Knowledge of General Education information will improve Description: Including pain rating scale, medication(s)/side effects and non-pharmacologic comfort measures Outcome: Adequate for Discharge   Problem: Health  Behavior/Discharge Planning: Goal: Ability to manage health-related needs will improve Outcome: Adequate for Discharge   Problem: Clinical Measurements: Goal: Ability to maintain clinical measurements within normal limits will improve Outcome: Adequate for Discharge Goal: Will remain free from infection Outcome: Adequate for Discharge Goal: Diagnostic test results will improve Outcome: Adequate for Discharge Goal: Respiratory complications will improve Outcome: Adequate for Discharge Goal: Cardiovascular complication will be avoided Outcome: Adequate for Discharge   Problem: Activity: Goal: Risk for activity intolerance will decrease Outcome: Adequate for Discharge   Problem: Nutrition: Goal: Adequate nutrition will be maintained Outcome: Adequate for Discharge   Problem: Coping: Goal: Level of anxiety will decrease Outcome: Adequate for Discharge   Problem: Elimination: Goal: Will not experience complications related to bowel motility Outcome: Adequate for Discharge Goal: Will not experience complications related to urinary retention Outcome: Adequate for Discharge   Problem: Pain Managment: Goal: General experience of comfort will improve Outcome: Adequate for Discharge   Problem: Safety: Goal: Ability to remain free from injury will improve Outcome: Adequate for Discharge   Problem: Skin Integrity: Goal: Risk for impaired skin integrity will decrease Outcome: Adequate for Discharge   Problem: Education: Goal: Knowledge of the prescribed therapeutic regimen will improve Outcome: Adequate for Discharge Goal: Understanding of activity limitations/precautions following surgery will improve Outcome: Adequate for Discharge Goal: Individualized Educational Video(s) Outcome: Adequate for Discharge   Problem: Activity: Goal: Ability to tolerate increased activity will improve Outcome: Adequate for Discharge   Problem: Pain Management: Goal: Pain level will  decrease with appropriate interventions Outcome: Adequate for Discharge

## 2022-07-29 DIAGNOSIS — S42292A Other displaced fracture of upper end of left humerus, initial encounter for closed fracture: Secondary | ICD-10-CM | POA: Diagnosis not present

## 2022-07-29 NOTE — Plan of Care (Signed)
  Problem: Education: Goal: Knowledge of General Education information will improve Description: Including pain rating scale, medication(s)/side effects and non-pharmacologic comfort measures Outcome: Progressing   Problem: Health Behavior/Discharge Planning: Goal: Ability to manage health-related needs will improve Outcome: Progressing   Problem: Clinical Measurements: Goal: Ability to maintain clinical measurements within normal limits will improve Outcome: Progressing Goal: Will remain free from infection Outcome: Progressing Goal: Diagnostic test results will improve Outcome: Progressing Goal: Respiratory complications will improve Outcome: Progressing Goal: Cardiovascular complication will be avoided Outcome: Progressing   Problem: Activity: Goal: Risk for activity intolerance will decrease Outcome: Progressing   Problem: Nutrition: Goal: Adequate nutrition will be maintained Outcome: Progressing   Problem: Coping: Goal: Level of anxiety will decrease Outcome: Progressing   Problem: Elimination: Goal: Will not experience complications related to bowel motility Outcome: Progressing Goal: Will not experience complications related to urinary retention Outcome: Progressing   Problem: Pain Managment: Goal: General experience of comfort will improve Outcome: Progressing   Problem: Safety: Goal: Ability to remain free from injury will improve Outcome: Progressing   Problem: Skin Integrity: Goal: Risk for impaired skin integrity will decrease Outcome: Progressing   Problem: Education: Goal: Knowledge of General Education information will improve Description: Including pain rating scale, medication(s)/side effects and non-pharmacologic comfort measures Outcome: Progressing   Problem: Health Behavior/Discharge Planning: Goal: Ability to manage health-related needs will improve Outcome: Progressing   Problem: Clinical Measurements: Goal: Ability to maintain  clinical measurements within normal limits will improve Outcome: Progressing Goal: Will remain free from infection Outcome: Progressing Goal: Diagnostic test results will improve Outcome: Progressing Goal: Respiratory complications will improve Outcome: Progressing Goal: Cardiovascular complication will be avoided Outcome: Progressing   Problem: Activity: Goal: Risk for activity intolerance will decrease Outcome: Progressing   Problem: Nutrition: Goal: Adequate nutrition will be maintained Outcome: Progressing   Problem: Coping: Goal: Level of anxiety will decrease Outcome: Progressing   Problem: Elimination: Goal: Will not experience complications related to bowel motility Outcome: Progressing Goal: Will not experience complications related to urinary retention Outcome: Progressing   Problem: Pain Managment: Goal: General experience of comfort will improve Outcome: Progressing   Problem: Safety: Goal: Ability to remain free from injury will improve Outcome: Progressing   Problem: Skin Integrity: Goal: Risk for impaired skin integrity will decrease Outcome: Progressing   Problem: Education: Goal: Knowledge of the prescribed therapeutic regimen will improve Outcome: Progressing Goal: Understanding of activity limitations/precautions following surgery will improve Outcome: Progressing Goal: Individualized Educational Video(s) Outcome: Progressing   Problem: Activity: Goal: Ability to tolerate increased activity will improve Outcome: Progressing   Problem: Pain Management: Goal: Pain level will decrease with appropriate interventions Outcome: Progressing

## 2022-07-29 NOTE — Progress Notes (Signed)
Subjective: 2 Days Post-Op Procedure(s): REVISION HUMERAL HEAD REVERSE SHOULDER ARTHROPLASTY Patient reports pain as mild.    Objective: Vital signs in last 24 hours: Temp:  [98.2 F (36.8 C)-98.5 F (36.9 C)] 98.2 F (36.8 C) (07/27 1453) Pulse Rate:  [78-82] 80 (07/27 1453) Resp:  [16-19] 19 (07/27 1453) BP: (110-153)/(54-61) 127/60 (07/27 1453) SpO2:  [94 %-100 %] 98 % (07/27 1453)  Intake/Output from previous day: 07/26 0701 - 07/27 0700 In: 840 [P.O.:840] Out: 1400 [Urine:1400] Intake/Output this shift: No intake/output data recorded.  Recent Labs    07/27/22 1233 07/28/22 0340 07/29/22 0405  HGB 11.4* 9.2* 8.2*   Recent Labs    07/28/22 0340 07/29/22 0405  WBC 10.7* 9.7  RBC 3.14* 2.77*  HCT 30.4* 26.4*  PLT 286 258   Recent Labs    07/27/22 1233  NA 137  K 4.0  CL 102  CO2 24  BUN 16  CREATININE 0.58  GLUCOSE 88  CALCIUM 9.0   No results for input(s): "LABPT", "INR" in the last 72 hours.  Neurologically intact Neurovascular intact Sensation intact distally Intact pulses distally Incision: dressing C/D/I and no drainage   Assessment/Plan: 2 Days Post-Op Procedure(s): REVISION HUMERAL HEAD REVERSE SHOULDER ARTHROPLASTY Advance diet Up with therapy Discharge to SNF Patient doing well POD2.  Minimal pain. Ambulating well.  Suitable for d/c to SNF.  Discharge order placed earlier today.  Shanon Payor 07/29/2022, 8:02 PM

## 2022-07-30 DIAGNOSIS — S42292A Other displaced fracture of upper end of left humerus, initial encounter for closed fracture: Secondary | ICD-10-CM | POA: Diagnosis not present

## 2022-07-30 NOTE — Plan of Care (Signed)
  Problem: Activity: Goal: Risk for activity intolerance will decrease Outcome: Adequate for Discharge   Problem: Elimination: Goal: Will not experience complications related to urinary retention Outcome: Progressing   Problem: Safety: Goal: Ability to remain free from injury will improve Outcome: Progressing   Problem: Health Behavior/Discharge Planning: Goal: Ability to manage health-related needs will improve Outcome: Progressing   Problem: Education: Goal: Knowledge of the prescribed therapeutic regimen will improve Outcome: Adequate for Discharge

## 2022-07-30 NOTE — Progress Notes (Signed)
    Patient doing well PO day 3 S/P reverse TSA by Dr Ave Filter. Pain is well controlled today, some soreness at the elbow, eating and drinking well, NL B/B function. Awaiting bed at SNF pending PSRR. She has been up with PT/OT   Physical Exam: Vitals:   07/29/22 2033 07/30/22 0623  BP: (!) 121/43 (!) 128/44  Pulse: 82 72  Resp: 19 18  Temp: 98.4 F (36.9 C) 98.1 F (36.7 C)  SpO2: 98% 97%    Dressing in place, sling in place, pt sitting up comfortably in chair with ice machine on shoulder NVI  POD #3 s/p reverse TSA by Dr Ave Filter  - up with PT/OT, encourage ambulation - Discharge to SNF when bed available pending PSSR ([place]) - Cont current px med PRN - ASA 81mg  daily for anticoagulation - NWB LUE, sling at all times

## 2022-07-30 NOTE — Progress Notes (Signed)
Physical Therapy Treatment Patient Details Name: Candice Nolan MRN: [id number] DOB: [birth year] Today's Date: 07/30/2022   History of Present Illness Pt is an 82 year old female s/p REVISION HUMERAL HEAD REVERSE SHOULDER ARTHROPLASTY on 07/27/22.  PMH: L TKA, macular degeneration, arthritis, skin CA    PT Comments  Pt agreeable to working with therapy. She tolerated ambulation distance well. She performed seated ROM exercises for her LEs. Ice to L shoulder at end of session. Plan is for ST SNF rehab at [facility].     Assistance Recommended at Discharge Intermittent Supervision/Assistance  If plan is discharge home, recommend the following:  Can travel by private vehicle    A little help with walking and/or transfers;A lot of help with bathing/dressing/bathroom;Help with stairs or ramp for entrance;Assistance with cooking/housework   Yes  Equipment Recommendations   (TBD at next venue)    Recommendations for Other Services       Precautions / Restrictions Precautions Precautions: Fall;Shoulder Type of Shoulder Precautions: "Patient is to remain in sling at all times. She can technically come out of the sling for gentle hand, wrist, and elbow motion but if there is any concern from OT that she is not comprehending that and she is moving her shoulder then she should remain in the sling at ALL times" Shoulder Interventions: At all times;Shoulder sling/immobilizer Restrictions Weight Bearing Restrictions: Yes LUE Weight Bearing: Non weight bearing     Mobility  Bed Mobility               General bed mobility comments: oob in recliner    Transfers Overall transfer level: Needs assistance Equipment used: None Transfers: Sit to/from Stand Sit to Stand: Min guard           General transfer comment: Very close min guard for safety. Increased time. Cues for safety, hand placement.    Ambulation/Gait Ambulation/Gait assistance: Min assist Gait Distance (Feet): 115  Feet Assistive device: 1 person hand held assist Gait Pattern/deviations: Step-through pattern, Decreased stride length       General Gait Details: 1 HHA on today-pt feels hemiwalker is more difficult for her to use. Pt tolerated distance well. Requires assist to maintain balance.   Stairs             Wheelchair Mobility     Tilt Bed    Modified Rankin (Stroke Patients Only)       Balance Overall balance assessment: Needs assistance, History of Falls         Standing balance support: During functional activity, Single extremity supported Standing balance-Leahy Scale: Poor                              Cognition Arousal/Alertness: Awake/alert Behavior During Therapy: WFL for tasks assessed/performed Overall Cognitive Status: Within Functional Limits for tasks assessed                                          Exercises General Exercises - Lower Extremity Ankle Circles/Pumps: AROM, Both, 20 reps Quad Sets: AROM, Both, 20 reps Heel Slides: AROM, Both, 20 reps Hip ABduction/ADduction: AROM, Both, 20 reps    General Comments        Pertinent Vitals/Pain Pain Assessment Pain Assessment: Faces Faces Pain Scale: Hurts little more Pain Location: L shoulder Pain Descriptors / Indicators: Discomfort, Sore Pain Intervention(s):  Monitored during session, Ice applied    Home Living                          Prior Function            PT Goals (current goals can now be found in the care plan section) Progress towards PT goals: Progressing toward goals    Frequency    Min 1X/week      PT Plan Current plan remains appropriate    Co-evaluation              AM-PAC PT "6 Clicks" Mobility   Outcome Measure  Help needed turning from your back to your side while in a flat bed without using bedrails?: A Little Help needed moving from lying on your back to sitting on the side of a flat bed without using  bedrails?: A Little Help needed moving to and from a bed to a chair (including a wheelchair)?: A Little Help needed standing up from a chair using your arms (e.g., wheelchair or bedside chair)?: A Little Help needed to walk in hospital room?: A Little Help needed climbing 3-5 steps with a railing? : A Little 6 Click Score: 18    End of Session Equipment Utilized During Treatment: Gait belt Activity Tolerance: Patient tolerated treatment well Patient left: in chair;with call bell/phone within reach;with chair alarm set   PT Visit Diagnosis: Difficulty in walking, not elsewhere classified (R26.2);Pain Pain - Right/Left: Left Pain - part of body: Shoulder     Time: 1610-9604 PT Time Calculation (min) (ACUTE ONLY): 20 min  Charges:    $Gait Training: 8-22 mins PT General Charges $$ ACUTE PT VISIT: 1 Visit                         Faye Ramsay, PT Acute Rehabilitation  Office: [phone]

## 2022-07-31 ENCOUNTER — Encounter (HOSPITAL_COMMUNITY): Payer: Self-pay | Admitting: Orthopedic Surgery

## 2022-07-31 DIAGNOSIS — S42292A Other displaced fracture of upper end of left humerus, initial encounter for closed fracture: Secondary | ICD-10-CM | POA: Diagnosis not present

## 2022-07-31 NOTE — Plan of Care (Signed)
  Problem: Elimination: Goal: Will not experience complications related to urinary retention Outcome: Adequate for Discharge   Problem: Education: Goal: Knowledge of the prescribed therapeutic regimen will improve Outcome: Adequate for Discharge

## 2022-07-31 NOTE — Progress Notes (Signed)
Physical Therapy Treatment Patient Details Name: Candice Nolan MRN: [id number] DOB: [birth year] Today's Date: 07/31/2022   History of Present Illness Pt is an 82 year old female s/p REVISION HUMERAL HEAD REVERSE SHOULDER ARTHROPLASTY on 07/27/22.  PMH: L TKA, macular degeneration, arthritis, skin CA    PT Comments  Pt progressing toward goals. Improving activity tol however continues to demo balance deficits placing pt at risk for falls,  Will benefit from post acute rehab    If plan is discharge home, recommend the following: A little help with walking and/or transfers;A lot of help with bathing/dressing/bathroom;Help with stairs or ramp for entrance;Assistance with cooking/housework   Can travel by private vehicle     Yes  Equipment Recommendations  Other (comment) (defer to SNF)    Recommendations for Other Services       Precautions / Restrictions Precautions Precautions: Fall;Shoulder Type of Shoulder Precautions: "Patient is to remain in sling at all times. She can technically come out of the sling for gentle hand, wrist, and elbow motion but if there is any concern from OT that she is not comprehending that and she is moving her shoulder then she should remain in the sling at ALL times" Shoulder Interventions: At all times;Shoulder sling/immobilizer Precaution Comments: reviewed precautions/allowed movement elbow/wrist/hand with pt; pt kept in shoulder immobilizer at all times Restrictions LUE Weight Bearing: Non weight bearing     Mobility  Bed Mobility               General bed mobility comments: oob in recliner    Transfers Overall transfer level: Needs assistance Equipment used: None Transfers: Sit to/from Stand Sit to Stand: Min guard, Min assist           General transfer comment: Very close min guard for safety. Increased time. Cues for safety, hand placement. posterior LOB on initial standing, wide compensatory BOS     Ambulation/Gait Ambulation/Gait assistance: Min assist Gait Distance (Feet): 160 Feet Assistive device: 1 person hand held assist Gait Pattern/deviations: Step-through pattern, Decreased stride length       General Gait Details: 1 HHA on today-pt feels hemiwalker or cane  is more difficult for her to use. Pt tolerated distance well. Requires assist throughout to maintain balance. highly unsteady gait   Stairs             Wheelchair Mobility     Tilt Bed    Modified Rankin (Stroke Patients Only)       Balance Overall balance assessment: Needs assistance, History of Falls   Sitting balance-Leahy Scale: Good     Standing balance support: During functional activity, Single extremity supported Standing balance-Leahy Scale: Poor Standing balance comment: able to static stand with close guarding. at least min assist and UE support for dynamic balance                            Cognition Arousal/Alertness: Awake/alert Behavior During Therapy: WFL for tasks assessed/performed Overall Cognitive Status: Within Functional Limits for tasks assessed                                 General Comments: Oriented x4, able to follow commands without difficulty        Exercises Other Exercises Other Exercises: STS x15 with cues for diminishing RUE support    General Comments        Pertinent Vitals/Pain  Pain Assessment Pain Assessment: Faces Faces Pain Scale: Hurts a little bit Pain Location: L shoulder Pain Descriptors / Indicators: Discomfort, Sore Pain Intervention(s): Limited activity within patient's tolerance, Monitored during session, Ice applied    Home Living                          Prior Function            PT Goals (current goals can now be found in the care plan section) Acute Rehab PT Goals PT Goal Formulation: With patient Time For Goal Achievement: 08/11/22 Potential to Achieve Goals: Good Progress towards  PT goals: Progressing toward goals    Frequency    Min 1X/week      PT Plan Current plan remains appropriate    Co-evaluation              AM-PAC PT "6 Clicks" Mobility   Outcome Measure  Help needed turning from your back to your side while in a flat bed without using bedrails?: A Little Help needed moving from lying on your back to sitting on the side of a flat bed without using bedrails?: A Little Help needed moving to and from a bed to a chair (including a wheelchair)?: A Little Help needed standing up from a chair using your arms (e.g., wheelchair or bedside chair)?: A Little Help needed to walk in hospital room?: A Little Help needed climbing 3-5 steps with a railing? : A Lot 6 Click Score: 17    End of Session Equipment Utilized During Treatment: Gait belt;Other (comment) (shoulder immob) Activity Tolerance: Patient tolerated treatment well Patient left: in chair;with call bell/phone within reach;with chair alarm set Nurse Communication: Mobility status PT Visit Diagnosis: Difficulty in walking, not elsewhere classified (R26.2);Pain Pain - Right/Left: Left Pain - part of body: Shoulder     Time: 1017-1030 PT Time Calculation (min) (ACUTE ONLY): 13 min  Charges:    $Gait Training: 8-22 mins PT General Charges $$ ACUTE PT VISIT: 1 Visit                     Kortlyn Koltz, PT  Acute Rehab Dept [facility]) [phone]  07/31/2022    [facility] 07/31/2022, 11:21 AM

## 2022-07-31 NOTE — Progress Notes (Signed)
PATIENT ID: Candice Nolan  MRN: [id number]  DOB/AGE:  August 11, 1940 / 82 y.o.  4 Days Post-Op Procedure(s): REVISION HUMERAL HEAD REVERSE SHOULDER ARTHROPLASTY  Subjective: Patient reports minimal pain in the left shoulder. No numbness or tingling in LUE. Voiding well through foley. Positive flatus.   Objective: Vital signs in last 24 hours: Temp:  [98.1 F (36.7 C)-98.5 F (36.9 C)] 98.1 F (36.7 C) (07/29 0607) Pulse Rate:  [69-84] 69 (07/29 0607) Resp:  [16-18] 16 (07/29 0607) BP: (127-157)/(44-54) 157/50 (07/29 0607) SpO2:  [97 %-100 %] 99 % (07/29 0607)  Intake/Output from previous day: 07/28 0701 - 07/29 0700 In: 120 [P.O.:120] Out: 2350 [Urine:2350]   Recent Labs    07/29/22 0405  HGB 8.2*   Recent Labs    07/29/22 0405  WBC 9.7  RBC 2.77*  HCT 26.4*  PLT 258     Physical Exam: Neurologically intact Sensation intact distally Intact pulses distally Incision: dressing C/D/I No cellulitis present Able to move left hand, wrist, elbow without difficulty Sling LUE  Assessment/Plan: 4 Days Post-Op Procedure(s): REVISION HUMERAL HEAD REVERSE SHOULDER ARTHROPLASTY   -up with PT/OT, encourage ambulation -Discharge to SNF when bed available pending PSSR ([facility]) -Cont current px med PRN -ASA 81mg  daily for anticoagulation -NWB LUE, sling at all times -Continue foley, abx per urology -Follow up in office in 10 days  Candice Ding L. [place], PA-C 07/31/2022, 8:16 AM

## 2022-07-31 NOTE — TOC Transition Note (Signed)
Transition of Care [facility]) - CM/SW Discharge Note  Patient Details  Name: Candice Nolan MRN: [id number] Date of Birth: Nov 27, 1940  Transition of Care [facility]) CM/SW Contact:  Ewing Schlein, LCSW Phone Number: 07/31/2022, 9:56 AM  Clinical Narrative: PASRR received: [id number] E. Insurance authorization completed. Plan auth ID # is: [id number]. Reference ID # is: [id number]. Patient has been approved for 07/31/2022-08/02/2022. Patient will go to room 114 and the number for report is [phone]. Discharge summary, discharge orders, and SNF transfer report faxed to facility in hub. Medical necessity form done; PTAR scheduled. Discharge packet completed. Patient notified of insurance approval and transportation. RN updated. TOC signing off.  Final next level of care: Skilled Nursing Facility Barriers to Discharge: Barriers Resolved  Patient Goals and CMS Choice CMS Medicare.gov Compare Post Acute Care list provided to:: Patient Choice offered to / list presented to : Patient  Discharge Placement PASRR number recieved: 07/30/22      Patient chooses bed at: [facility] Patient to be transferred to facility by: PTAR Patient and family notified of of transfer: 07/31/22  Discharge Plan and Services Additional resources added to the After Visit Summary for   In-house Referral: Clinical Social Work Post Acute Care Choice: Skilled Nursing Facility          DME Arranged: N/A DME Agency: NA  Social Determinants of Health (SDOH) Interventions SDOH Screenings   Food Insecurity: No Food Insecurity (07/27/2022)  Housing: Low Risk  (07/27/2022)  Transportation Needs: No Transportation Needs (07/27/2022)  Utilities: Not At Risk (07/27/2022)  Tobacco Use: Low Risk  (07/27/2022)   Readmission Risk Interventions     No data to display

## 2022-07-31 NOTE — Discharge Summary (Signed)
Patient ID: MAKANI BODGE MRN: [id number] DOB/AGE: [birth year] 83 y.o.  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 Received Suprane and sccinylcholine 03/15/09 w/o issue.    Depression    GERD (gastroesophageal reflux disease)    H/O bladder infections    Incontinence    at young age , and bowel as a young adult . Had bladder reconstruction and part of colon was used as top of bladder . Pt. self caths 4 x a day   Macular degeneration    Pneumonia    as a child   Sleep related leg cramps    and in water during exercies    Surgeries: Procedure(s): REVISION HUMERAL HEAD REVERSE SHOULDER ARTHROPLASTY on 07/27/2022   Consultants:   Discharged Condition: Improved  Hospital Course: Yarisbel Boateng Bathe is an 82 y.o. female who was admitted 07/27/2022 for operative treatment ofS/P ORIF (open reduction internal fixation) fracture. Patient has severe unremitting pain that affects sleep, daily activities, and work/hobbies. After pre-op clearance the patient was taken to the operating room on 07/27/2022 and underwent  Procedure(s): REVISION HUMERAL HEAD REVERSE SHOULDER ARTHROPLASTY.    Patient was given perioperative antibiotics:  Anti-infectives (From admission, onward)    Start     Dose/Rate Route Frequency Ordered Stop   07/27/22 2000  ceFAZolin (ANCEF) IVPB 1 g/50 mL premix        1 g 100 mL/hr over 30 Minutes Intravenous Every 6 hours 07/27/22 1732 07/28/22 0918   07/27/22 1145  ceFAZolin  (ANCEF) IVPB 2g/100 mL premix        2 g 200 mL/hr over 30 Minutes Intravenous On call to O.R. 07/27/22 1140 07/27/22 1422        Patient was given sequential compression devices, early ambulation, and chemoprophylaxis to prevent DVT.  Patient benefited maximally from hospital stay and there were no complications.  Arrangements were made for discharge back to [facility] where she normally lives in independent living. Urology recommends continuing foley cath since patient self cath 4-6 times daily. Urology has written for antibiotic.     Recent vital signs: Patient Vitals for the past 24 hrs:  BP Temp Pulse Resp SpO2  07/31/22 0607 (!) 157/50 98.1 F (36.7 C) 69 16 99 %  07/30/22 2147 (!) 140/54 98.3 F (36.8 C) 84 17 97 %  07/30/22 1357 (!) 127/44 98.5 F (36.9 C) 79 18 100 %     Recent laboratory studies:  Recent Labs    07/29/22 0405  WBC 9.7  HGB 8.2*  HCT 26.4*  PLT 258     Discharge Medications:   Allergies as of 07/31/2022       Reactions   Ascorbate Swelling   Facial ASORBIC ACID - can not have in pill form - foods are ok   Contrast Media [iodinated Contrast Media] Shortness Of Breath   Iodine Shortness Of Breath   Chloramphenicol    UNSPECIFIED REACTION    Pentazocine Anxiety   Talwin  Tyloxapol Anxiety, Other (See Comments)        Medication List     STOP taking these medications    HYDROcodone-acetaminophen 5-325 MG tablet Commonly known as: NORCO/VICODIN   ibuprofen 200 MG tablet Commonly known as: ADVIL   meloxicam 15 MG tablet Commonly known as: MOBIC       TAKE these medications    ALPRAZolam 0.25 MG tablet Commonly known as: XANAX Take 0.125 mg by mouth See admin instructions. take 1/2 tab 30 minutes prior to eye injections every 8 weeks   amLODipine 2.5 MG tablet Commonly known as: NORVASC Take 2.5 mg by mouth daily.   ARIPiprazole 2 MG tablet Commonly known as: ABILIFY Take 2 mg by mouth daily.   aspirin EC 81 MG  tablet Take 1 tablet (81 mg total) by mouth 2 (two) times daily. What changed: when to take this   Belsomra 10 MG Tabs Generic drug: Suvorexant Take 10 mg by mouth at bedtime.   Botox 100 units Solr injection Generic drug: botulinum toxin Type A Inject 100 Units into the muscle every 3 (three) months.   CALCIUM-VITAMIN D PO Take 600 mg by mouth every evening.   CRANBERRY PO Take 650 mg by mouth daily. Gummy   desvenlafaxine 100 MG 24 hr tablet Commonly known as: PRISTIQ Take 100 mg by mouth daily.   EYLEA IO Inject into the eye See admin instructions. Every 16 weeks   methocarbamol 500 MG tablet Commonly known as: ROBAXIN Take 1 tablet (500 mg total) by mouth every 6 (six) hours as needed for muscle spasms.   methylphenidate 20 MG tablet Commonly known as: RITALIN Take 30 mg by mouth daily.   multivitamin with minerals Tabs tablet Take 1 tablet by mouth daily.   oxyCODONE 5 MG immediate release tablet Commonly known as: Oxy IR/ROXICODONE Take 1 tablet (5 mg total) by mouth every 4 (four) hours as needed for moderate pain.   PreserVision AREDS 2+Multi Vit Caps Take 1 tablet by mouth 2 (two) times daily.   Stay Awake Maximum Strength 200 MG Tabs tablet Generic drug: caffeine Take 100 mg by mouth every 4 (four) hours as needed (alertness).   triamcinolone cream 0.1 % Commonly known as: KENALOG Apply 1 Application topically daily as needed (bottom off foot).   Vitamin D (Ergocalciferol) 1.25 MG (50000 UNIT) Caps capsule Commonly known as: DRISDOL Take 1 capsule by mouth once a week.        Diagnostic Studies: DG Humerus Left  Result Date: 07/27/2022 CLINICAL DATA:  Shoulder after plasty revision EXAM: LEFT HUMERUS - 2+ VIEW COMPARISON:  06/26/2022 FLUOROSCOPY TIME:  Radiation Exposure Index (as provided by the fluoroscopic device): 0.67 mGy If the device does not provide the exposure index: Fluoroscopy Time:  2 seconds Number of Acquired Images:  3 FINDINGS:  Initial images demonstrate proximal periprosthetic humeral fracture. The humeral component of the prosthesis within removed and extended version passing into the midshaft of the humerus was placed. No new fracture or dislocation is noted. IMPRESSION: Status post shoulder arthroplasty revision. Electronically Signed   By: Alcide Clever M.D.   On: 07/27/2022 19:34   DG C-Arm 1-60 Min-No Report  Result Date: 07/27/2022 Fluoroscopy was utilized by the requesting physician.  No radiographic interpretation.   DG C-Arm 1-60 Min-No Report  Result Date: 07/27/2022 Fluoroscopy was utilized by the requesting physician.  No radiographic interpretation.    Disposition:   Discharge Instructions     Call MD for:  difficulty breathing, headache  or visual disturbances   Complete by: As directed    Call MD for:  persistant nausea and vomiting   Complete by: As directed    Call MD for:  severe uncontrolled pain   Complete by: As directed    Call MD for:  temperature >100.4   Complete by: As directed    Diet - low sodium heart healthy   Complete by: As directed    Increase activity slowly   Complete by: As directed         Contact information for follow-up providers     [place], [facility], PA-C. Schedule an appointment as soon as possible for a visit on 08/11/2022.   Specialty: Orthopedic Surgery Contact information: [address] [place] [address] [phone]              Contact information for after-discharge care     Destination     [facility] .   Service: Skilled Nursing Contact information: [address] [place] [address] [phone]                      Signed: Joice Lofts L. [place], PA-C 07/31/2022, 8:18 AM
